# Patient Record
Sex: Male | Born: 1985 | Race: Black or African American | Hispanic: No | Marital: Single | Smoking: Former smoker
Health system: Southern US, Community
[De-identification: ages and names within clinical notes are randomized; demographics above are authoritative.]

## PROBLEM LIST (undated history)

## (undated) DIAGNOSIS — J45909 Unspecified asthma, uncomplicated: Secondary | ICD-10-CM

## (undated) DIAGNOSIS — A5149 Other secondary syphilitic conditions: Secondary | ICD-10-CM

## (undated) DIAGNOSIS — J302 Other seasonal allergic rhinitis: Secondary | ICD-10-CM

## (undated) HISTORY — DX: Other seasonal allergic rhinitis: J30.2

## (undated) HISTORY — PX: TONSILLECTOMY: SUR1361

## (undated) HISTORY — DX: Other secondary syphilitic conditions: A51.49

## (undated) HISTORY — DX: Unspecified asthma, uncomplicated: J45.909

---

## 2008-06-15 ENCOUNTER — Ambulatory Visit (HOSPITAL_COMMUNITY): Admission: RE | Admit: 2008-06-15 | Discharge: 2008-06-15 | Payer: Self-pay | Admitting: Allergy and Immunology

## 2011-04-10 ENCOUNTER — Ambulatory Visit (INDEPENDENT_AMBULATORY_CARE_PROVIDER_SITE_OTHER): Payer: BC Managed Care – PPO | Admitting: Internal Medicine

## 2011-04-10 DIAGNOSIS — Z113 Encounter for screening for infections with a predominantly sexual mode of transmission: Secondary | ICD-10-CM

## 2011-04-10 DIAGNOSIS — M545 Low back pain: Secondary | ICD-10-CM

## 2011-04-10 LAB — POCT UA - MICROSCOPIC ONLY
Bacteria, U Microscopic: NEGATIVE
Casts, Ur, LPF, POC: NEGATIVE
Mucus, UA: NEGATIVE

## 2011-04-10 LAB — POCT URINALYSIS DIPSTICK
Bilirubin, UA: NEGATIVE
Blood, UA: NEGATIVE
Glucose, UA: NEGATIVE
Spec Grav, UA: 1.02

## 2011-04-10 MED ORDER — CYCLOBENZAPRINE HCL 10 MG PO TABS: 10.0000 mg | ORAL_TABLET | Freq: Three times a day (TID) | ORAL | Status: AC | PRN

## 2011-04-10 MED ORDER — MELOXICAM 7.5 MG PO TABS: 7.5000 mg | ORAL_TABLET | Freq: Every day | ORAL | Status: DC

## 2011-04-10 NOTE — Progress Notes (Signed)
  Subjective:    Patient ID: Keith Becker, male    DOB: 05-31-1985, 26 y.o.   MRN: [id number]  Back Pain This is a recurrent problem. The current episode started more than 1 month ago. The problem occurs intermittently. The problem is unchanged. The pain is present in the lumbar spine. The quality of the pain is described as aching. The pain does not radiate. The pain is mild. The symptoms are aggravated by bending and twisting. Pertinent negatives include no abdominal pain, bladder incontinence, headaches, leg pain, perianal numbness or weakness. He has tried NSAIDs for the symptoms.  Keith Becker works two jobs, one at [employer] for [employer].  He has been doing this for 5 years and noticed about a year ago that he was having some low back stiffness and pain.  Ibuprophen helps at times.  He has no other associated symptoms.  It is not hurting him today. He also requests STD testing.  He has SSP, usually uses a condom but not always.  HE denies any lesions, penile discharge, pain or other symptoms.  He is unsure of his last STD test.      Review of Systems  Gastrointestinal: Negative for abdominal pain.  Genitourinary: Negative for bladder incontinence.  Musculoskeletal: Positive for back pain.  Neurological: Negative for weakness and headaches.  All other systems reviewed and are negative.       Objective:   Physical Exam  Vitals reviewed. Constitutional: He is oriented to person, place, and time. He appears well-developed and well-nourished.  HENT:  Head: Normocephalic.  Right Ear: External ear normal.  Left Ear: External ear normal.  Eyes: Conjunctivae are normal.  Neck: Neck supple.  Cardiovascular: Normal rate, regular rhythm and normal heart sounds.   Pulmonary/Chest: Effort normal and breath sounds normal.  Musculoskeletal:       L/S increased pain with flexion and hyperextension.  He has tight hamstrings.  Neurological: He is alert and oriented to person, place, and time. He has normal  reflexes. He displays normal reflexes.  Skin: Skin is warm and dry.  Psychiatric: He has a normal mood and affect. His behavior is normal.          Assessment & Plan:  Uriprobe, HIV, RPR, Herpes 1 & 2 pending.  Pt advised to use condoms consistently! Musculoskeletal Pain for the L/S:  For his low back pain he is given M/S exercises to strengthen his back.  Mobic and Flexeril given to use prn.  RTC if not improved in one week.

## 2011-04-11 LAB — RPR

## 2011-04-11 LAB — HIV ANTIBODY (ROUTINE TESTING W REFLEX): HIV: NONREACTIVE

## 2011-04-13 LAB — GC/CHLAMYDIA PROBE AMP, URINE: GC Probe Amp, Urine: NEGATIVE

## 2011-04-14 LAB — HSV(HERPES SIMPLEX VRS) I + II AB-IGG
HSV 1 Glycoprotein G Ab, IgG: <0.1 IV
HSV 2 Glycoprotein G Ab, IgG: 0.17 IV

## 2011-04-17 ENCOUNTER — Encounter: Payer: Self-pay | Admitting: Radiology

## 2011-04-18 ENCOUNTER — Telehealth: Payer: Self-pay

## 2011-04-18 NOTE — Telephone Encounter (Signed)
Please contact pt he forgot to give [place] the right number to contact him, he would like for someone to call him with his results on std screening

## 2011-04-19 NOTE — Telephone Encounter (Signed)
Pt notified of labs

## 2011-06-27 ENCOUNTER — Ambulatory Visit (INDEPENDENT_AMBULATORY_CARE_PROVIDER_SITE_OTHER): Payer: BC Managed Care – PPO | Admitting: Internal Medicine

## 2011-06-27 VITALS — BP 127/78 | HR 78 | Temp 98.1°F | Resp 16 | Ht 70.25 in | Wt 153.4 lb

## 2011-06-27 DIAGNOSIS — A609 Anogenital herpesviral infection, unspecified: Secondary | ICD-10-CM

## 2011-06-27 DIAGNOSIS — R21 Rash and other nonspecific skin eruption: Secondary | ICD-10-CM

## 2011-06-27 DIAGNOSIS — Z113 Encounter for screening for infections with a predominantly sexual mode of transmission: Secondary | ICD-10-CM

## 2011-06-27 DIAGNOSIS — R300 Dysuria: Secondary | ICD-10-CM

## 2011-06-27 DIAGNOSIS — N342 Other urethritis: Secondary | ICD-10-CM

## 2011-06-27 MED ORDER — VALACYCLOVIR HCL 1 G PO TABS: 1000.0000 mg | ORAL_TABLET | Freq: Two times a day (BID) | ORAL | Status: AC

## 2011-06-27 NOTE — Patient Instructions (Signed)
Urethritis, Adult Urethritis is an inflammation (soreness) of the urethra (the tube exiting from the bladder). It is often caused by germs that may be spread through sexual contact. TREATMENT  Urethritis will usually respond to antibiotics. These are medications that kill germs. Take all the medicine given to you. You may feel better in a couple days, but TAKE ALL MEDICINE or the infection may not be completely cured and may become more difficult to treat. Response can generally be expected in 7 to 10 days. You may require additional treatment after more testing. HOME CARE INSTRUCTIONS  Not have sex until the test results are known and treatment is completed.   Know that you may be asked to notify your sex partner when your final test results are back.   Finish all medications as prescribed.   Prevent sexually transmitted infections including AIDS. Practice safe sex. Use condoms.  SEEK MEDICAL CARE IF:   Your symptoms are not improved in 2 to 3 days.   Your symptoms are getting worse.   Your develop abdominal pain.   You develop joint pain.  SEEK IMMEDIATE MEDICAL CARE IF:   You have a fever.   You develop severe pain in the belly, back or side.   You develop repeated vomiting.  TEST RESULTS Not all test results are available during your visit. If your test results are not back during the visit, make an appointment with your caregiver to find out the results. Do not assume everything is normal if you have not heard from your caregiver or the medical facility. It is important for you to follow-up on all of your test results. Document Released: 07/22/2000 Document Revised: 01/15/2011 Document Reviewed: 02/11/2009 [facility] Patient Information 2012 [place], [place].Herpes Simplex Herpes simplex is generally classified as Type 1 or Type 2. Type 1 is generally the type that is responsible for cold sores. Type 2 is generally associated with sexually transmitted diseases. We now know that  most of the thoughts on these viruses are inaccurate. We find that HSV1 is also present genitally and HSV2 can be present orally, but this will vary in different locations of the world. Herpes simplex is usually detected by doing a culture. Blood tests are also available for this virus; however, the accuracy is often not as good.  PREPARATION FOR TEST No preparation or fasting is necessary. NORMAL FINDINGS  No virus present   No HSV antigens or antibodies present  Ranges for normal findings may vary among different laboratories and hospitals. You should always check with your doctor after having lab work or other tests done to discuss the meaning of your test results and whether your values are considered within normal limits. MEANING OF TEST  Your caregiver will go over the test results with you and discuss the importance and meaning of your results, as well as treatment options and the need for additional tests if necessary. OBTAINING THE TEST RESULTS  It is your responsibility to obtain your test results. Ask the lab or department performing the test when and how you will get your results. Document Released: 02/29/2004 Document Revised: 01/15/2011 Document Reviewed: 01/07/2008 [facility] Patient Information 2012 [place], [place].

## 2011-06-27 NOTE — Progress Notes (Signed)
  Subjective:    Patient ID: Keith Becker, male    DOB: Nov 06, 1985, 26 y.o.   MRN: [id number]  HPI Same sex partner C/o pain and rash penis, no discharge seen. Use condom, not for oral part Hx of previous STD  Review of Systems     Objective:   Physical Exam Penis has 2 small separate ulcer/vesicles No discharge seen No adenopathy       Assessment & Plan:  Uriprobe, viral culture done Do hiv and rpr 1 month , sooner prn  Doxy, valtrex po

## 2011-07-28 ENCOUNTER — Ambulatory Visit: Payer: BC Managed Care – PPO

## 2012-09-28 ENCOUNTER — Ambulatory Visit (INDEPENDENT_AMBULATORY_CARE_PROVIDER_SITE_OTHER): Payer: BC Managed Care – PPO | Admitting: Family Medicine

## 2012-09-28 VITALS — BP 110/62 | HR 74 | Temp 98.8°F | Resp 16 | Ht 70.0 in | Wt 162.0 lb

## 2012-09-28 DIAGNOSIS — D649 Anemia, unspecified: Secondary | ICD-10-CM

## 2012-09-28 DIAGNOSIS — R197 Diarrhea, unspecified: Secondary | ICD-10-CM

## 2012-09-28 DIAGNOSIS — K8062 Calculus of gallbladder and bile duct with acute cholecystitis without obstruction: Secondary | ICD-10-CM

## 2012-09-28 DIAGNOSIS — R1011 Right upper quadrant pain: Secondary | ICD-10-CM

## 2012-09-28 DIAGNOSIS — K8010 Calculus of gallbladder with chronic cholecystitis without obstruction: Secondary | ICD-10-CM

## 2012-09-28 LAB — POCT CBC
MCH, POC: 23.5 pg — AB (ref 27–31.2)
MCV: 74.9 fL — AB (ref 80–97)
MID (cbc): 0.6 (ref 0–0.9)
POC LYMPH PERCENT: 23.6 %L (ref 10–50)
POC MID %: 8.1 %M (ref 0–12)
Platelet Count, POC: 263 10*3/uL (ref 142–424)
RDW, POC: 14.7 %
WBC: 7.3 10*3/uL (ref 4.6–10.2)

## 2012-09-28 NOTE — Patient Instructions (Addendum)
Start an iron supplement twice a day - I recommend ferrous sulfate 324mg  (65mg  of elemental iron).  Take this on an empty stomach 30 minutes before eating or 2 hours after a meal.  Take it with a vitamin C supplement or a small glass of orange juice.  Cholelithiasis Cholelithiasis (also called gallstones) is a form of gallbladder disease where gallstones form in your gallbladder. The gallbladder is a non-essential organ that stores bile made in the liver, which helps digest fats. Gallstones begin as small crystals and slowly grow into stones. Gallstone pain occurs when the gallbladder spasms, and a gallstone is blocking the duct. Pain can also occur when a stone passes out of the duct.  Women are more likely to develop gallstones than men. Other factors that increase the risk of gallbladder disease are:  Having multiple pregnancies. Physicians sometimes advise removing diseased gallbladders before future pregnancies.  Obesity.  Diets heavy in fried foods and fat.  Increasing age (older than 72).  Prolonged use of medications containing male hormones.  Diabetes mellitus.  Rapid weight loss.  Family history of gallstones (heredity). SYMPTOMS  Feeling sick to your stomach (nauseous).  Abdominal pain.  Yellowing of the skin (jaundice).  Sudden pain. It may persist from several minutes to several hours.  Worsening pain with deep breathing or when jarred.  Fever.  Tenderness to the touch. In some cases, when gallstones do not move into the bile duct, people have no pain or symptoms. These are called "silent" gallstones. TREATMENT In severe cases, emergency surgery may be required. HOME CARE INSTRUCTIONS   Only take over-the-counter or prescription medicines for pain, discomfort, or fever as directed by your caregiver.  Follow a low-fat diet until seen again. Fat causes the gallbladder to contract, which can result in pain.  Follow up as instructed. Attacks are almost always  recurrent and surgery is usually required for permanent treatment. SEEK IMMEDIATE MEDICAL CARE IF:   Your pain increases and is not controlled by medications.  You have an oral temperature above 102 F (38.9 C), not controlled by medication.  You develop nausea and vomiting. MAKE SURE YOU:   Understand these instructions.  Will watch your condition.  Will get help right away if you are not doing well or get worse. Document Released: 01/22/2005 Document Revised: 04/20/2011 Document Reviewed: 03/27/2010 [facility] Patient Information 2014 [place], [place]. Iron Deficiency Anemia There are many types of anemia. Iron deficiency anemia is the most common. Iron deficiency anemia is a decrease in the number of red blood cells caused by too little iron. Without enough iron, your body does not produce enough hemoglobin. Hemoglobin is a substance in red blood cells that carries oxygen to the body's tissues. Iron deficiency anemia may leave you tired and short of breath. CAUSES   Lack of iron in the diet.  This may be seen in infants and children, because there is little iron in milk.  This may be seen in adults who do not eat enough iron-rich foods.  This may be seen in pregnant or breastfeeding women who do not take iron supplements. There is a much higher need for iron intake at these times.  Poor absorption of iron, as seen with intestinal disorders.  Intestinal bleeding.  Heavy periods. SYMPTOMS  Mild anemia may not be noticeable. Symptoms may include:  Fatigue.  Headache.  Pale skin.  Weakness.  Shortness of breath.  Dizziness.  Cold hands and feet.  Fast or irregular heartbeat. DIAGNOSIS  Diagnosis requires a  thorough evaluation and physical exam by your caregiver.  Blood tests are generally used to confirm iron deficiency anemia.  Additional tests may be done to find the underlying cause of your anemia. These may include:  Testing for blood in the stool (fecal  occult blood test).  A procedure to see inside the colon and rectum (colonoscopy).  A procedure to see inside the esophagus and stomach (endoscopy). TREATMENT   Correcting the cause of the iron deficiency is the first step.  Medicines, such as oral contraceptives, can make heavy menstrual flows lighter.  Antibiotics and other medicines can be used to treat peptic ulcers.  Surgery may be needed to remove a bleeding polyp, tumor, or fibroid.  Often, iron supplements (ferrous sulfate) are taken.  For the best iron absorption, take these supplements with an empty stomach.  You may need to take the supplements with food if you cannot tolerate them on an empty stomach. Vitamin C improves the absorption of iron. Your caregiver may recommend taking your iron tablets with a glass of orange juice or vitamin C supplement.  Milk and antacids should not be taken at the same time as iron supplements. They may interfere with the absorption of iron.  Iron supplements can cause constipation. A stool softener is often recommended.  Pregnant and breastfeeding women will need to take extra iron, because their normal diet usually will not provide the required amount.  Patients who cannot tolerate iron by mouth can take it through a vein (intravenously) or by an injection into the muscle. HOME CARE INSTRUCTIONS   Ask your dietitian for help with diet questions.  Take iron and vitamins as directed by your caregiver.  Eat a diet rich in iron. Eat liver, lean beef, whole-grain bread, eggs, dried fruit, and dark green leafy vegetables. SEEK IMMEDIATE MEDICAL CARE IF:   You have a fainting episode. Do not drive yourself. Call your local emergency services (911 in [place] if no other help is available.  You have chest pain, nausea, or vomiting.  You develop severe or increased shortness of breath with activities.  You develop weakness or increased thirst.  You have a rapid heartbeat.  You develop  unexplained sweating or become lightheaded when getting up from a chair or bed. MAKE SURE YOU:   Understand these instructions.  Will watch your condition.  Will get help right away if you are not doing well or get worse. Document Released: 01/24/2000 Document Revised: 04/20/2011 Document Reviewed: 06/04/2009 [facility] Patient Information 2014 [place], [place]. Iron-Rich Diet An iron-rich diet contains foods that are good sources of iron. Iron is an important mineral that helps your body produce hemoglobin. Hemoglobin is a protein in red blood cells that carries oxygen to the body's tissues. Sometimes, the iron level in your blood can be low. This may be caused by:  A lack of iron in your diet.  Blood loss.  Times of growth, such as during pregnancy or during a child's growth and development. Low levels of iron can cause a decrease in the number of red blood cells. This can result in iron deficiency anemia. Iron deficiency anemia symptoms include:  Tiredness.  Weakness.  Irritability.  Increased chance of infection. Here are some recommendations for daily iron intake:  Males older than 25 years of age need 8 mg of iron per day.  Women ages 69 to 70 need 18 mg of iron per day.  Pregnant women need 27 mg of iron per day, and women who are over  84 years of age and breastfeeding need 9 mg of iron per day.  Women over the age of 58 need 8 mg of iron per day. SOURCES OF IRON There are 2 types of iron that are found in food: heme iron and nonheme iron. Heme iron is absorbed by the body better than nonheme iron. Heme iron is found in meat, poultry, and fish. Nonheme iron is found in grains, beans, and vegetables. Heme Iron Sources Food / Iron (mg)  Chicken liver, 3 oz (85 g)/ 10 mg  Beef liver, 3 oz (85 g)/ 5.5 mg  Oysters, 3 oz (85 g)/ 8 mg  Beef, 3 oz (85 g)/ 2 to 3 mg  Shrimp, 3 oz (85 g)/ 2.8 mg  [place], 3 oz (85 g)/ 2 mg  Chicken, 3 oz (85 g) / 1 mg  Fish (tuna,  halibut), 3 oz (85 g)/ 1 mg  Pork, 3 oz (85 g)/ 0.9 mg Nonheme Iron Sources Food / Iron (mg)  Ready-to-eat breakfast cereal, iron-fortified / 3.9 to 7 mg  Tofu,  cup / 3.4 mg  Kidney beans,  cup / 2.6 mg  Baked potato with skin / 2.7 mg  Asparagus,  cup / 2.2 mg  Avocado / 2 mg  Dried peaches,  cup / 1.6 mg  Raisins,  cup / 1.5 mg  Soy milk, 1 cup / 1.5 mg  Whole-wheat bread, 1 slice / 1.2 mg  Spinach, 1 cup / 0.8 mg  Broccoli,  cup / 0.6 mg IRON ABSORPTION Certain foods can decrease the body's absorption of iron. Try to avoid these foods and beverages while eating meals with iron-containing foods:  Coffee.  Tea.  Fiber.  Soy. Foods containing vitamin C can help increase the amount of iron your body absorbs from iron sources, especially from nonheme sources. Eat foods with vitamin C along with iron-containing foods to increase your iron absorption. Foods that are high in vitamin C include many fruits and vegetables. Some good sources are:  Fresh orange juice.  Oranges.  Strawberries.  Mangoes.  Grapefruit.  Red bell peppers.  Green bell peppers.  Broccoli.  Potatoes with skin.  Tomato juice. Document Released: 09/09/2004 Document Revised: 04/20/2011 Document Reviewed: 07/17/2010 [facility] Patient Information 2014 [place], [place].

## 2012-09-28 NOTE — Progress Notes (Signed)
Subjective:    Patient ID: Keith Becker, male    DOB: Mar 06, 1985, 27 y.o.   MRN: [id number] Chief Complaint  Patient presents with  . Diarrhea    x 3 weeks  . Abdominal Pain  . Fever    1st 3 days of illness    HPI  Micah Flesher out to eat on 7/29 - [employer].  Then the following morning had abdominal pain and large volume diarrhea that quickly turned watery, had a headache, felt very ill. Also with low grade fever for 2d. Tried some liquid peptobismol as well as imodium ad which was only partially effective so switched to tablets from liquid.  Got better after 1 wk but notices that still whenever he eats greasy or fatty foods the diarrhea and central abdominal pain gets worse again - does ok on healthy foods - like salads.  No nausea or vomiting.  No blood in stool or melena.  Urine normal. No personal or family h/o gallbladder or pancreatic issues. Not taking any other otc meds, vitamins, or supplements. Drinks alcohol rarely.    Past Medical History  Diagnosis Date  . Asthma   . Seasonal allergies    No current outpatient prescriptions on file prior to visit.   No current facility-administered medications on file prior to visit.   No Known Allergies \ Past Surgical History  Procedure Laterality Date  . Tonsillectomy     Family History  Problem Relation Age of Onset  . Hypertension Mother   . Hypertension Father       Review of Systems  Constitutional: Positive for appetite change and fatigue. Negative for fever, chills, diaphoresis and activity change.  Respiratory: Negative for shortness of breath.   Cardiovascular: Negative for chest pain.  Gastrointestinal: Positive for abdominal pain and diarrhea. Negative for nausea, vomiting, constipation, blood in stool, abdominal distention, anal bleeding and rectal pain.  Genitourinary: Negative for dysuria, frequency and decreased urine volume.  Hematological: Negative for adenopathy.      BP 110/62  Pulse 74  Temp(Src)  98.8 F (37.1 C)  Resp 16  Ht 5\' 10"  (1.778 m)  Wt 162 lb (73.483 kg)  BMI 23.24 kg/m2  SpO2 99% Objective:   Physical Exam  Constitutional: He appears well-developed and well-nourished. No distress.  HENT:  Head: Normocephalic and atraumatic.  Neck: Normal range of motion. Neck supple. No thyromegaly present.  Cardiovascular: Normal rate, regular rhythm and normal heart sounds.   Pulmonary/Chest: Effort normal and breath sounds normal.  Abdominal: Soft. Normal appearance and bowel sounds are normal. He exhibits no distension and no mass. There is no hepatosplenomegaly. There is tenderness in the right upper quadrant, epigastric area and left upper quadrant. There is positive Murphy's sign. There is no rigidity, no rebound, no guarding, no CVA tenderness and no tenderness at McBurney's point. No hernia.  Genitourinary: Rectum normal and prostate normal. Rectal exam shows no tenderness and anal tone normal. Guaiac negative stool.  Lymphadenopathy:    He has no cervical adenopathy.  Skin: He is not diaphoretic.      Results for orders placed in visit on 09/28/12  POCT CBC      Result Value Range   WBC 7.3  4.6 - 10.2 K/uL   Lymph, poc 1.7  0.6 - 3.4   POC LYMPH PERCENT 23.6  10 - 50 %L   MID (cbc) 0.6  0 - 0.9   POC MID % 8.1  0 - 12 %M   POC Granulocyte 5.0  2 - 6.9   Granulocyte percent 68.3  37 - 80 %G   RBC 5.40  4.69 - 6.13 M/uL   Hemoglobin 12.7 (*) 14.1 - 18.1 g/dL   HCT, POC 16.1 (*) 09.6 - 53.7 %   MCV 74.9 (*) 80 - 97 fL   MCH, POC 23.5 (*) 27 - 31.2 pg   MCHC 31.4 (*) 31.8 - 35.4 g/dL   RDW, POC 04.5     Platelet Count, POC 263  142 - 424 K/uL   MPV 8.8  0 - 99.8 fL    Assessment & Plan:  Calculus of gallbladder with other cholecystitis, without mention of obstruction - Plan: US Abdomen Limited RUQ  RUQ abdominal pain and Diarrhea - Plan: US Abdomen Limited RUQ, POCT CBC - From history and exam, I strongly suspect that pt has gallstones - will refer for [place] to  confirm and then will likely need surgical referral. Until then, avoid greasy and fatty foods and ok to cont using prn imodium  Anemia - new - advised iron supp and recheck in sev weeks.  Consider ferritin at f/u as could be sickle cell trait rather than low iron.  Meds ordered this encounter  Medications  . bismuth subsalicylate (PEPTO BISMOL) 262 MG chewable tablet    Sig: Chew 524 mg by mouth as needed for indigestion.

## 2012-10-03 ENCOUNTER — Ambulatory Visit
Admission: RE | Admit: 2012-10-03 | Discharge: 2012-10-03 | Disposition: A | Discharge status: Home / Self Care | Payer: BC Managed Care – PPO | Source: Ambulatory Visit | Attending: Family Medicine | Admitting: Family Medicine

## 2012-10-04 ENCOUNTER — Telehealth: Payer: Self-pay

## 2012-10-04 NOTE — Telephone Encounter (Signed)
Notes Recorded by Sherren Mocha, MD on 10/04/2012 at 10:04 AM Your ultrasound came back normal so you do NOT have gallstones to explain your symptoms. We can do some more testing in clinic - including stool testing -to try to determine the cause of your symptoms - just come back to clinic at your first available opportunity. We can also recheck on your anemia then. If you would prefer, I would be happy to just refer you on to a GI doctor right now but it may take several weeks to get a new patient appointment - let me know what you prefer.  Left message for call back

## 2012-10-04 NOTE — Telephone Encounter (Signed)
Pt is looking for results of his scan   Best number [phone]

## 2012-10-05 NOTE — Telephone Encounter (Signed)
Called again. He prefers to come back in here for additional labs, he is not feeling better, he plans to come back in on Thursday, to you [facility]

## 2012-11-15 ENCOUNTER — Ambulatory Visit (INDEPENDENT_AMBULATORY_CARE_PROVIDER_SITE_OTHER): Payer: BC Managed Care – PPO | Admitting: Physician Assistant

## 2012-11-15 VITALS — BP 122/68 | HR 61 | Temp 97.8°F | Resp 18 | Ht 71.0 in | Wt 163.6 lb

## 2012-11-15 DIAGNOSIS — B009 Herpesviral infection, unspecified: Secondary | ICD-10-CM

## 2012-11-15 DIAGNOSIS — Z202 Contact with and (suspected) exposure to infections with a predominantly sexual mode of transmission: Secondary | ICD-10-CM

## 2012-11-15 DIAGNOSIS — Z7251 High risk heterosexual behavior: Secondary | ICD-10-CM

## 2012-11-15 DIAGNOSIS — Z9189 Other specified personal risk factors, not elsewhere classified: Secondary | ICD-10-CM

## 2012-11-15 MED ORDER — VALACYCLOVIR HCL 500 MG PO TABS: 500.0000 mg | ORAL_TABLET | Freq: Two times a day (BID) | ORAL | Status: DC

## 2012-11-15 NOTE — Progress Notes (Signed)
  Subjective:    Patient ID: Keith Becker, male    DOB: 07-10-1985, 27 y.o.   MRN: [id number]  HPI   Keith Becker is a pleasant 27 yr old male here with concern for an HSV outbreak.  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  This is his first outbreak since diagnosis.  Thinks this outbreak is worse than the initial outbreak.  Noted genital sores, similar to what he had previously, that started 3 days ago.  The sores are painful.  He has not noticed and blisters or fluid from the lesions.  Also notes some itching on his back and neck, wonders if this could also be HSV.  He does have a history of eczema.    Additionally requests STI testing today.  [facility], all negative at that time.  He is currently sexually active with 5-6 male partners.  He uses condoms 50% of the time.  He denies penile discharge, dysuria, testicular pain, abd pain.    Pt asks about HIV PrEP.  One of his sexual partners is known to be HIV+, and he is interested in reducing his risk of infection in general.     Review of Systems  Constitutional: Negative for fever and chills.  HENT: Negative.   Respiratory: Negative.   Cardiovascular: Negative.   Gastrointestinal: Negative.   Genitourinary: Positive for genital sores. Negative for dysuria, discharge and testicular pain.  Musculoskeletal: Negative.   Skin: Negative.   Neurological: Negative.        Objective:   Physical Exam  Vitals reviewed. Constitutional: He is oriented to person, place, and time. He appears well-developed and well-nourished. No distress.  HENT:  Head: Normocephalic and atraumatic.  Eyes: Conjunctivae are normal. No scleral icterus.  Cardiovascular: Normal rate, regular rhythm and normal heart sounds.   Pulmonary/Chest: Effort normal and breath sounds normal. He has no wheezes. He has no rales.  Abdominal: Soft. There is no tenderness.  Genitourinary:    No discharge found.  Few ulcerated and scabbed  erythematous lesions on penis; no vesicles or pustules; appears c/w with healing HSV lesions  Neurological: He is alert and oriented to person, place, and time.  Skin: Skin is warm and dry.  Psychiatric: He has a normal mood and affect. His behavior is normal.        Assessment & Plan:  HSV-1 (herpes simplex virus 1) infection - Plan: valACYclovir (VALTREX) 500 MG tablet  Possible exposure to STD - Plan: RPR, HIV antibody, GC/Chlamydia Probe Amp  High risk sexual behavior - Plan: RPR, HIV antibody, GC/Chlamydia Probe Amp   Keith Becker is a 27 yr old male here with genital lesions that appear consistent with HSV.  This is his second outbreak.  Will treat with valtrex bid x 3 days.  Pt not interested in suppression at this time, but we discussed this today.    Uriprobe pending.  HIV, RPR pending.  Pt with known HIV exposures.  Inquires about PrEP today.  We briefly discussed this, but would refer him to ID if he decides to pursue this.  I have provided him with educational materials from [place], and he knows to call if would like to be referred to the ID clinic.  Will follow up on labs and treat if necessary.

## 2012-11-15 NOTE — Patient Instructions (Addendum)
Begin taking the valacyclovir twice daily for 3 days.  I have given you extra so that you can begin right away if you have another outbreak.  We can always consider taking the medication for suppression to prevent outbreaks - that dose is 500mg  once daily.  I will let you know when the rest of your labs are back.    Use condoms with every sexual encounter!  This is the only way to protect yourself from infection   Safe Sex Safe sex is about reducing the risk of giving or getting a sexually transmitted disease (STD). STDs are spread through sexual contact involving the genitals, mouth, or rectum. Some STDS can be cured and others cannot. Safe sex can also prevent unintended pregnancies.  SAFE SEX PRACTICES  Limit your sexual activity to only one partner who is only having sex with you.  Talk to your partner about their past partners, past STDs, and drug use.  Use a condom every time you have sexual intercourse. This includes vaginal, oral, and anal sexual activity. Both females and males should wear condoms during oral sex. Only use latex or polyurethane condoms and water-based lubricants. Petroleum-based lubricants or oils used to lubricate a condom will weaken the condom and increase the chance that it will break. The condom should be in place from the beginning to the end of sexual activity. Wearing a condom reduces, but does not completely eliminate, your risk of getting or giving a STD. STDs can be spread by contact with skin of surrounding areas.  Get vaccinated for hepatitis B and HPV.  Avoid alcohol and recreational drugs which can affect your judgement. You may forget to use a condom or participate in high-risk sex.  For females, avoid douching after sexual intercourse. Douching can spread an infection farther into the reproductive tract.  Check your body for signs of sores, blisters, rashes, or unusual discharge. See your caregiver if you notice any of these signs.  Avoid sexual  contact if you have symptoms of an infection or are being treated for an STD. If you or your partner has herpes, avoid sexual contact when blisters are present. Use condoms at all other times.  See your caregiver for regular screenings, examinations, and tests for STDs. Before having sex with a new partner, each of you should be screened for STDs and talk about the results with your partner. BENEFITS OF SAFE SEX   There is less of a chance of getting or giving an STD.  You can prevent unwanted or unintended pregnancies.  By discussing safer sex concerns with your partner, you may increase feelings of intimacy, comfort, trust, and honesty between the both of you. Document Released: 03/05/2004 Document Revised: 10/21/2011 Document Reviewed: 07/20/2011 [facility] Patient Information 2014 [place], [place].

## 2013-05-08 ENCOUNTER — Ambulatory Visit (INDEPENDENT_AMBULATORY_CARE_PROVIDER_SITE_OTHER): Payer: BC Managed Care – PPO | Admitting: Emergency Medicine

## 2013-05-08 VITALS — BP 132/86 | HR 56 | Temp 98.3°F | Resp 16 | Ht 70.5 in | Wt 163.0 lb

## 2013-05-08 DIAGNOSIS — Z202 Contact with and (suspected) exposure to infections with a predominantly sexual mode of transmission: Secondary | ICD-10-CM

## 2013-05-08 NOTE — Patient Instructions (Signed)
Safe Sex Safe sex is about reducing the risk of giving or getting a sexually transmitted disease (STD). STDs are spread through sexual contact involving the genitals, mouth, or rectum. Some STDS can be cured and others cannot. Safe sex can also prevent unintended pregnancies.  SAFE SEX PRACTICES  Limit your sexual activity to only one partner who is only having sex with you.  Talk to your partner about their past partners, past STDs, and drug use.  Use a condom every time you have sexual intercourse. This includes vaginal, oral, and anal sexual activity. Both females and males should wear condoms during oral sex. Only use latex or polyurethane condoms and water-based lubricants. Petroleum-based lubricants or oils used to lubricate a condom will weaken the condom and increase the chance that it will break. The condom should be in place from the beginning to the end of sexual activity. Wearing a condom reduces, but does not completely eliminate, your risk of getting or giving a STD. STDs can be spread by contact with skin of surrounding areas.  Get vaccinated for hepatitis B and HPV.  Avoid alcohol and recreational drugs which can affect your judgement. You may forget to use a condom or participate in high-risk sex.  For females, avoid douching after sexual intercourse. Douching can spread an infection farther into the reproductive tract.  Check your body for signs of sores, blisters, rashes, or unusual discharge. See your caregiver if you notice any of these signs.  Avoid sexual contact if you have symptoms of an infection or are being treated for an STD. If you or your partner has herpes, avoid sexual contact when blisters are present. Use condoms at all other times.  See your caregiver for regular screenings, examinations, and tests for STDs. Before having sex with a new partner, each of you should be screened for STDs and talk about the results with your partner. BENEFITS OF SAFE SEX   There  is less of a chance of getting or giving an STD.  You can prevent unwanted or unintended pregnancies.  By discussing safer sex concerns with your partner, you may increase feelings of intimacy, comfort, trust, and honesty between the both of you. Document Released: 03/05/2004 Document Revised: 10/21/2011 Document Reviewed: 07/20/2011 [employer] Patient Information 2014 [employer].  

## 2013-05-08 NOTE — Progress Notes (Signed)
[facility] [address], [place] [address] [phone]  Date:  05/08/2013   Name:  Keith Becker   DOB:  [birth year]   MRN:  [id number]  PCP:  No primary provider on file.    Chief Complaint: Exposure to STD   History of Present Illness:  Keith Becker is a 28 y.o. very pleasant male patient who presents with the following:  Has regular unprotected sex.  Requests STD tests.  No symptoms.  History of GC in past.  No improvement with over the counter medications or other home remedies. Denies other complaint or health concern today.   Patient Active Problem List   Diagnosis Date Noted  . Recurrent low back pain 04/10/2011  . Screening examination for venereal disease 04/10/2011    Past Medical History  Diagnosis Date  . Asthma   . Seasonal allergies     Past Surgical History  Procedure Laterality Date  . Tonsillectomy      History  Substance Use Topics  . Smoking status: Current Some Day Smoker  . Smokeless tobacco: Not on file  . Alcohol Use: Yes    Family History  Problem Relation Age of Onset  . Hypertension Mother   . Hypertension Father     No Known Allergies  Medication list has been reviewed and updated.  Current Outpatient Prescriptions on File Prior to Visit  Medication Sig Dispense Refill  . valACYclovir (VALTREX) 500 MG tablet Take 1 tablet (500 mg total) by mouth 2 (two) times daily. For 3 days  30 tablet  0   No current facility-administered medications on file prior to visit.    Review of Systems:  As per HPI, otherwise negative.    Physical Examination: Filed Vitals:   05/08/13 1519  BP: 132/86  Pulse: 56  Temp: 98.3 F (36.8 C)  Resp: 16   Filed Vitals:   05/08/13 1519  Height: 5' 10.5" (1.791 m)  Weight: 163 lb (73.936 kg)   Body mass index is 23.05 kg/(m^2). Ideal Body Weight: Weight in (lb) to have BMI = 25: 176.4   GEN: WDWN, NAD, Non-toxic, Alert & Oriented x 3 HEENT: Atraumatic,  Normocephalic.  Ears and Nose: No external deformity. EXTR: No clubbing/cyanosis/edema NEURO: Normal gait.  PSYCH: Normally interactive. Conversant. Not depressed or anxious appearing.  Calm demeanor.  Genitalia:  Normal male  Assessment and Plan: Std exposure labs Signed,  Phillips OdorJeffery Anderson, MD

## 2013-05-09 LAB — HIV ANTIBODY (ROUTINE TESTING W REFLEX): HIV: NONREACTIVE

## 2013-05-09 LAB — GC/CHLAMYDIA PROBE AMP
CT Probe RNA: NEGATIVE
GC Probe RNA: NEGATIVE

## 2013-05-09 LAB — RPR

## 2013-11-08 ENCOUNTER — Ambulatory Visit (INDEPENDENT_AMBULATORY_CARE_PROVIDER_SITE_OTHER): Payer: BC Managed Care – PPO | Admitting: Emergency Medicine

## 2013-11-08 VITALS — BP 128/76 | HR 66 | Temp 99.1°F | Resp 16 | Ht 70.5 in | Wt 163.0 lb

## 2013-11-08 DIAGNOSIS — Z23 Encounter for immunization: Secondary | ICD-10-CM

## 2013-11-08 DIAGNOSIS — Z202 Contact with and (suspected) exposure to infections with a predominantly sexual mode of transmission: Secondary | ICD-10-CM

## 2013-11-08 DIAGNOSIS — Z7251 High risk heterosexual behavior: Secondary | ICD-10-CM

## 2013-11-08 DIAGNOSIS — K921 Melena: Secondary | ICD-10-CM

## 2013-11-08 LAB — POCT CBC
Granulocyte percent: 54.2 %G (ref 37–80)
HEMATOCRIT: 41.1 % — AB (ref 43.5–53.7)
Hemoglobin: 12.8 g/dL — AB (ref 14.1–18.1)
Lymph, poc: 2.2 (ref 0.6–3.4)
MCH: 22.8 pg — AB (ref 27–31.2)
MCHC: 31.1 g/dL — AB (ref 31.8–35.4)
MCV: 73.4 fL — AB (ref 80–97)
MID (cbc): 0.4 (ref 0–0.9)
MPV: 7.3 fL (ref 0–99.8)
POC GRANULOCYTE: 3.1 (ref 2–6.9)
POC LYMPH PERCENT: 39.2 %L (ref 10–50)
POC MID %: 6.6 %M (ref 0–12)
Platelet Count, POC: 207 10*3/uL (ref 142–424)
RBC: 5.6 M/uL (ref 4.69–6.13)
RDW, POC: 15.4 %
WBC: 5.7 10*3/uL (ref 4.6–10.2)

## 2013-11-08 NOTE — Patient Instructions (Signed)

## 2013-11-08 NOTE — Progress Notes (Signed)
[facility] [address], [place] [address] [phone]  Date:  11/08/2013   Name:  Keith CoonDaronte Moede   DOB:  May 15, 1985   MRN:  [id number]  PCP:  No PCP Per Patient    Chief Complaint: Exposure to STD, Flu Vaccine, wants to be checked for a hernia, Rectal Bleeding and Medication Refill   History of Present Illness:  Keith CoonDaronte Chavarria is a 28 y.o. very pleasant male patient who presents with the following:  Requesting a flu shot anad STD check as he has an outbreak of HSV2 and that reminds him to get tested. Says that he has frequent BRBPR with stools.  No pain.   Concerned he may have a hernia.   No improvement with over the counter medications or other home remedies. Denies other complaint or health concern today.   Patient Active Problem List   Diagnosis Date Noted  . Recurrent low back pain 04/10/2011  . Screening examination for venereal disease 04/10/2011    Past Medical History  Diagnosis Date  . Asthma   . Seasonal allergies     Past Surgical History  Procedure Laterality Date  . Tonsillectomy      History  Substance Use Topics  . Smoking status: Current Some Day Smoker  . Smokeless tobacco: Not on file  . Alcohol Use: Yes    Family History  Problem Relation Age of Onset  . Hypertension Mother   . Hypertension Father     No Known Allergies  Medication list has been reviewed and updated.  Current Outpatient Prescriptions on File Prior to Visit  Medication Sig Dispense Refill  . valACYclovir (VALTREX) 500 MG tablet Take 1 tablet (500 mg total) by mouth 2 (two) times daily. For 3 days  30 tablet  0   No current facility-administered medications on file prior to visit.    Review of Systems:  As per HPI, otherwise negative.    Physical Examination: Filed Vitals:   11/08/13 1347  BP: 128/76  Pulse: 66  Temp: 99.1 F (37.3 C)  Resp: 16   Filed Vitals:   11/08/13 1347  Height: 5' 10.5" (1.791 m)  Weight: 163 lb  (73.936 kg)   Body mass index is 23.05 kg/(m^2). Ideal Body Weight: Weight in (lb) to have BMI = 25: 176.4  GEN: WDWN, NAD, Non-toxic, A & O x 3 HEENT: Atraumatic, Normocephalic. Neck supple. No masses, No LAD. Ears and Nose: No external deformity. CV: RRR, No M/G/R. No JVD. No thrill. No extra heart sounds. PULM: CTA B, no wheezes, crackles, rhonchi. No retractions. No resp. distress. No accessory muscle use. ABD: S, NT, ND, +BS. No rebound. No HSM. EXTR: No c/c/e NEURO Normal gait.  PSYCH: Normally interactive. Conversant. Not depressed or anxious appearing.  Calm demeanor.  GENITALIA;  Numerous scarred lesions on glans and shaft  Scrotum, epididymis and cord and testes normal no hernia    Assessment and Plan: STD check Rectal bleeding  Signed,  Phillips OdorJeffery Mahlia Fernando, MD

## 2013-11-09 LAB — HIV ANTIBODY (ROUTINE TESTING W REFLEX): HIV 1&2 Ab, 4th Generation: NONREACTIVE

## 2013-11-09 LAB — RPR: RPR Ser Ql: REACTIVE — AB

## 2013-11-09 LAB — FLUORESCENT TREPONEMAL AB(FTA)-IGG-BLD: FLUORESCENT TREPONEMAL ABS: REACTIVE — AB

## 2013-11-09 LAB — GC/CHLAMYDIA PROBE AMP
CT Probe RNA: NEGATIVE
GC Probe RNA: NEGATIVE

## 2013-11-09 LAB — RPR TITER: RPR Titer: 1:2 {titer}

## 2013-11-10 ENCOUNTER — Ambulatory Visit (INDEPENDENT_AMBULATORY_CARE_PROVIDER_SITE_OTHER): Payer: BC Managed Care – PPO | Admitting: Internal Medicine

## 2013-11-10 VITALS — BP 110/74 | HR 60 | Temp 98.2°F | Resp 18 | Ht 71.0 in | Wt 161.0 lb

## 2013-11-10 DIAGNOSIS — N489 Disorder of penis, unspecified: Secondary | ICD-10-CM

## 2013-11-10 DIAGNOSIS — R599 Enlarged lymph nodes, unspecified: Secondary | ICD-10-CM

## 2013-11-10 DIAGNOSIS — R591 Generalized enlarged lymph nodes: Secondary | ICD-10-CM

## 2013-11-10 DIAGNOSIS — R21 Rash and other nonspecific skin eruption: Secondary | ICD-10-CM

## 2013-11-10 DIAGNOSIS — A519 Early syphilis, unspecified: Secondary | ICD-10-CM

## 2013-11-10 MED ORDER — PENICILLIN G BENZATHINE 1200000 UNIT/2ML IM SUSP
2.4000 10*6.[IU] | Freq: Once | INTRAMUSCULAR | Status: AC
  Administered 2013-11-10: 2.4 10*6.[IU] via INTRAMUSCULAR

## 2013-11-10 MED ORDER — DOXYCYCLINE HYCLATE 100 MG PO TABS: 100.0000 mg | ORAL_TABLET | Freq: Two times a day (BID) | ORAL | Status: DC

## 2013-11-10 NOTE — Progress Notes (Signed)
   Subjective:    Patient ID: Keith Becker, male    DOB: 14-Feb-1985, 28 y.o.   MRN: [id number]  HPI [name] is a 28 y.o. Male who presents to clinic stating he was seen two days ago by Marice PotterJeff Anderson in which he was tested for STD's. He c/o genital lesions in which he tested positive for syphilis.  He was informed to RTC today for treatment.  Never had syphilis before. Has hsv and a hx of gonorrhea. Has sex with multiple men. Discussed protection and risk. Has healing chancres and lymp nodes tender in groin. See std screening tests, all negative except rpr  Review of Systems     Objective:   Physical Exam  Constitutional: He is oriented to person, place, and time. He appears well-developed and well-nourished.  HENT:  Head: Normocephalic.  Mouth/Throat: Oropharynx is clear and moist.  Eyes: EOM are normal. Pupils are equal, round, and reactive to light.  Neck: Normal range of motion. Neck supple.  Pulmonary/Chest: Effort normal.  Abdominal: Soft.  Genitourinary: Testes normal.    Circumcised. Penile tenderness present. No penile erythema. No discharge found.  3 healed chancres on shaft and glans  Musculoskeletal: Normal range of motion.  Lymphadenopathy:       Right: Inguinal adenopathy present.       Left: Inguinal adenopathy present.  Neurological: He is alert and oriented to person, place, and time. He exhibits normal muscle tone. Coordination normal.  Psychiatric: He has a normal mood and affect. His behavior is normal. Judgment and thought content normal.          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

## 2013-11-10 NOTE — Patient Instructions (Addendum)
Sexually Transmitted Disease A sexually transmitted disease (STD) is a disease or infection that may be passed (transmitted) from person to person, usually during sexual activity. This may happen by way of saliva, semen, blood, vaginal mucus, or urine. Common STDs include:   Gonorrhea.   Chlamydia.   Syphilis.   HIV and AIDS.   Genital herpes.   Hepatitis B and C.   Trichomonas.   Human papillomavirus (HPV).   Pubic lice.   Scabies.  Mites.  Bacterial vaginosis. WHAT ARE CAUSES OF STDs? An STD may be caused by bacteria, a virus, or parasites. STDs are often transmitted during sexual activity if one person is infected. However, they may also be transmitted through nonsexual means. STDs may be transmitted after:   Sexual intercourse with an infected person.   Sharing sex toys with an infected person.   Sharing needles with an infected person or using unclean piercing or tattoo needles.  Having intimate contact with the genitals, mouth, or rectal areas of an infected person.   Exposure to infected fluids during birth. WHAT ARE THE SIGNS AND SYMPTOMS OF STDs? Different STDs have different symptoms. Some people may not have any symptoms. If symptoms are present, they may include:   Painful or bloody urination.   Pain in the pelvis, abdomen, vagina, anus, throat, or eyes.   A skin rash, itching, or irritation.  Growths, ulcerations, blisters, or sores in the genital and anal areas.  Abnormal vaginal discharge with or without bad odor.   Penile discharge in men.   Fever.   Pain or bleeding during sexual intercourse.   Swollen glands in the groin area.   Yellow skin and eyes (jaundice). This is seen with hepatitis.   Swollen testicles.  Infertility.  Sores and blisters in the mouth. HOW ARE STDs DIAGNOSED? To make a diagnosis, your health care provider may:   Take a medical history.   Perform a physical exam.   Take a sample of  any discharge to examine.  Swab the throat, cervix, opening to the penis, rectum, or vagina for testing.  Test a sample of your first morning urine.   Perform blood tests.   Perform a Pap test, if this applies.   Perform a colposcopy.   Perform a laparoscopy.  HOW ARE STDs TREATED? Treatment depends on the STD. Some STDs may be treated but not cured.   Chlamydia, gonorrhea, trichomonas, and syphilis can be cured with antibiotic medicine.   Genital herpes, hepatitis, and HIV can be treated, but not cured, with prescribed medicines. The medicines lessen symptoms.   Genital warts from HPV can be treated with medicine or by freezing, burning (electrocautery), or surgery. Warts may come back.   HPV cannot be cured with medicine or surgery. However, abnormal areas may be removed from the cervix, vagina, or vulva.   If your diagnosis is confirmed, your recent sexual partners need treatment. This is true even if they are symptom-free or have a negative culture or evaluation. They should not have sex until their health care providers say it is okay. HOW CAN I REDUCE MY RISK OF GETTING AN STD? Take these steps to reduce your risk of getting an STD:  Use latex condoms, dental dams, and water-soluble lubricants during sexual activity. Do not use petroleum jelly or oils.  Avoid having multiple sex partners.  Do not have sex with someone who has other sex partners.  Do not have sex with anyone you do not know or who is at   high risk for an STD.  Avoid risky sex practices that can break your skin.  Do not have sex if you have open sores on your mouth or skin.  Avoid drinking too much alcohol or taking illegal drugs. Alcohol and drugs can affect your judgment and put you in a vulnerable position.  Avoid engaging in oral and anal sex acts.  Get vaccinated for HPV and hepatitis. If you have not received these vaccines in the past, talk to your health care provider about whether one  or both might be right for you.   If you are at risk of being infected with HIV, it is recommended that you take a prescription medicine daily to prevent HIV infection. This is called pre-exposure prophylaxis (PrEP). You are considered at risk if:  You are a man who has sex with other men (MSM).  You are a heterosexual man or woman and are sexually active with more than one partner.  You take drugs by injection.  You are sexually active with a partner who has HIV.  Talk with your health care provider about whether you are at high risk of being infected with HIV. If you choose to begin PrEP, you should first be tested for HIV. You should then be tested every 3 months for as long as you are taking PrEP.  WHAT SHOULD I DO IF I THINK I HAVE AN STD?  See your health care provider.   Tell your sexual partner(s). They should be tested and treated for any STDs.  Do not have sex until your health care provider says it is okay. WHEN SHOULD I GET IMMEDIATE MEDICAL CARE? Contact your health care provider right away if: Syphilis Syphilis is an infectious disease. It can cause serious complications if left untreated.  CAUSES  Syphilis is caused by a type of bacteria called Treponema pallidum. It is most commonly spread through sexual contact. Syphilis may also spread to a fetus through the blood of the mother.  SIGNS AND SYMPTOMS Symptoms vary depending on the stage of the disease. Some symptoms may disappear without treatment. However, this does not mean that the infection is gone. One form of syphilis (called latent syphilis) has no symptoms.  Primary Syphilis Painless sores (chancres) in and around the genital organs and mouth. Swollen lymph nodes near the sores. Secondary Syphilis A rash or sores over any portion of the body, including the palms of the hands and soles of the feet. Fever. Headache. Sore throat. Swollen lymph nodes. New sores in the mouth or on the genitals. Feeling  generally ill. Having pain in the joints. Tertiary Syphilis The third stage of syphilis involves severe damage to different organs in the body, such as the brain, spinal cord, and heart. Signs and symptoms may include:  Dementia. Personality and mood changes. Difficulty walking. Heart failure. Fainting. Enlargement (aneurysm) of the aorta. Tumors of the skin, bones, or liver. Muscle weakness. Sudden "lightning" pains, numbness, or tingling. Problems with coordination. Vision changes. DIAGNOSIS  A physical exam will be done. Blood tests will be done to confirm the diagnosis. If the disease is in the first or second stages, a fluid (drainage) sample from a sore or rash may be examined under a microscope to detect the disease-causing bacteria. Fluid around the spine may need to be examined to detect brain damage or inflammation of the brain lining (meningitis). If the disease is in the third stage, X-rays, CT scans, MRIs, echocardiograms, ultrasounds, or cardiac catheterization may also be done  to detect disease of the heart, aorta, or brain. TREATMENT  Syphilis can be cured with antibiotic medicine if a diagnosis is made early. During the first day of treatment, you may experience fever, chills, headache, nausea, or aching all over your body. This is a normal reaction to the antibiotics.  HOME CARE INSTRUCTIONS  Take your antibiotic medicine as directed by your health care provider. Finish the antibiotic even if you start to feel better. Incomplete treatment will put you at risk for continued infection and could be life threatening. Take medicines only as directed by your health care provider. Do not have sexual intercourse until your treatment is completed or as directed by your health care provider. Inform your recent sexual partners that you were diagnosed with syphilis. They need to seek care and treatment, even if they have no symptoms. It is necessary that all your sexual partners be  tested for infection and treated if they have the disease. Keep all follow-up visits as directed by your health care provider. It is important to keep all your appointments. If your test results are not ready during your visit, make an appointment with your health care provider to find out the results. Do not assume everything is normal if you have not heard from your health care provider or the medical facility. It is your responsibility to get your test results. SEEK MEDICAL CARE IF: You continue to have any of the following 24 hours after beginning treatment: Fever. Chills. Headache. Nausea. Aching all over your body. You have symptoms of an allergic reaction to medicine, such as: Chills. A headache. Light-headedness. A new rash (especially hives). Difficulty breathing. MAKE SURE YOU:  Understand these instructions. Will watch your condition. Will get help right away if you are not doing well or get worse. Document Released: 11/16/2012 Document Revised: 06/12/2013 Document Reviewed: 11/16/2012 [facility] Patient Information 2015 [place], [place]. This information is not intended to replace advice given to you by your health care provider. Make sure you discuss any questions you have with your health care provider.   You have severe abdominal pain.  You are a man and notice swelling or pain in your testicles.  You are a woman and notice swelling or pain in your vagina. Document Released: 04/18/2002 Document Revised: 01/31/2013 Document Reviewed: 08/16/2012 [facility] Patient Information 2015 [place], [place]. This information is not intended to replace advice given to you by your health care provider. Make sure you discuss any questions you have with your health care provider.

## 2013-12-18 ENCOUNTER — Encounter: Payer: Self-pay | Admitting: Emergency Medicine

## 2013-12-27 ENCOUNTER — Ambulatory Visit (INDEPENDENT_AMBULATORY_CARE_PROVIDER_SITE_OTHER): Payer: BC Managed Care – PPO | Admitting: Family Medicine

## 2013-12-27 VITALS — BP 120/72 | HR 55 | Temp 98.6°F | Resp 16 | Ht 71.0 in | Wt 158.2 lb

## 2013-12-27 DIAGNOSIS — Z202 Contact with and (suspected) exposure to infections with a predominantly sexual mode of transmission: Secondary | ICD-10-CM

## 2013-12-27 NOTE — Patient Instructions (Signed)
Referral is being made to infectious disease per your request  We will let you know the results of your tests.

## 2013-12-27 NOTE — Progress Notes (Signed)
Subjective: 28 year old male who is here for a follow-up visit regarding his recent treatment for syphilis. The sore has resolved. He has continued risk of STDs, and would like other testing done also. He has been studying things and is interested in pursuing prep therapy regimen for prophylaxis preexposure for HIV. He has had multiple same-sex partners, and recognizes his risk. He is not having any symptoms at this time.  Objective: Still is a little residual hypopigmented area on the shaft of his penis visible from his previous infection, but all signs of acute infection resolved.  Assessment: Status post syphilis STD wrist  Plan: HIV, RPR, URiprobe  Make referral to infectious disease to try and see if his treatment can be given him.

## 2013-12-28 LAB — GC/CHLAMYDIA PROBE AMP
CT Probe RNA: NEGATIVE
GC PROBE AMP APTIMA: NEGATIVE

## 2013-12-28 LAB — RPR

## 2013-12-28 LAB — HIV ANTIBODY (ROUTINE TESTING W REFLEX): HIV 1&2 Ab, 4th Generation: NONREACTIVE

## 2014-01-15 ENCOUNTER — Ambulatory Visit: Payer: BC Managed Care – PPO | Admitting: Infectious Disease

## 2014-01-29 ENCOUNTER — Ambulatory Visit (INDEPENDENT_AMBULATORY_CARE_PROVIDER_SITE_OTHER): Payer: BLUE CROSS/BLUE SHIELD | Admitting: Infectious Disease

## 2014-01-29 ENCOUNTER — Encounter: Payer: Self-pay | Admitting: Infectious Disease

## 2014-01-29 VITALS — BP 116/75 | HR 74 | Temp 98.4°F | Wt 157.0 lb

## 2014-01-29 DIAGNOSIS — Z113 Encounter for screening for infections with a predominantly sexual mode of transmission: Secondary | ICD-10-CM

## 2014-01-29 DIAGNOSIS — A5149 Other secondary syphilitic conditions: Secondary | ICD-10-CM

## 2014-01-29 DIAGNOSIS — Z7251 High risk heterosexual behavior: Secondary | ICD-10-CM | POA: Insufficient documentation

## 2014-01-29 LAB — COMPLETE METABOLIC PANEL WITH GFR
ALT: 15 U/L (ref 0–53)
AST: 15 U/L (ref 0–37)
Albumin: 4.4 g/dL (ref 3.5–5.2)
Alkaline Phosphatase: 56 U/L (ref 39–117)
BUN: 10 mg/dL (ref 6–23)
CO2: 27 mEq/L (ref 19–32)
Calcium: 9.7 mg/dL (ref 8.4–10.5)
Chloride: 104 mEq/L (ref 96–112)
Creat: 0.73 mg/dL (ref 0.50–1.35)
GFR, Est African American: >89 mL/min
GFR, Est Non African American: >89 mL/min
Glucose, Bld: 85 mg/dL (ref 70–99)
Potassium: 4.2 mEq/L (ref 3.5–5.3)
Sodium: 142 mEq/L (ref 135–145)
Total Bilirubin: 0.9 mg/dL (ref 0.2–1.2)
Total Protein: 7.2 g/dL (ref 6.0–8.3)

## 2014-01-29 LAB — CBC WITH DIFFERENTIAL/PLATELET
Basophils Absolute: 0 10*3/uL (ref 0.0–0.1)
Basophils Relative: 1 % (ref 0–1)
Eosinophils Absolute: 0.1 10*3/uL (ref 0.0–0.7)
Eosinophils Relative: 3 % (ref 0–5)
HCT: 40.4 % (ref 39.0–52.0)
HEMOGLOBIN: 13 g/dL (ref 13.0–17.0)
LYMPHS ABS: 2.1 10*3/uL (ref 0.7–4.0)
LYMPHS PCT: 49 % — AB (ref 12–46)
MCH: 23 pg — ABNORMAL LOW (ref 26.0–34.0)
MCHC: 32.2 g/dL (ref 30.0–36.0)
MCV: 71.5 fL — ABNORMAL LOW (ref 78.0–100.0)
MPV: 9.9 fL (ref 9.4–12.4)
Monocytes Absolute: 0.4 10*3/uL (ref 0.1–1.0)
Monocytes Relative: 10 % (ref 3–12)
NEUTROS PCT: 37 % — AB (ref 43–77)
Neutro Abs: 1.6 10*3/uL — ABNORMAL LOW (ref 1.7–7.7)
Platelets: 205 10*3/uL (ref 150–400)
RBC: 5.65 MIL/uL (ref 4.22–5.81)
RDW: 15.6 % — ABNORMAL HIGH (ref 11.5–15.5)
WBC: 4.2 10*3/uL (ref 4.0–10.5)

## 2014-01-29 NOTE — Progress Notes (Signed)
Subjective:    Patient ID: Keith Becker, male    DOB: 02-05-1986, 28 y.o.   MRN: [id number]  HPI  Keith Becker is a 28 year old African American man who is at extremely high risk for acquiring HIV infection.  He has had well over 100 sexual partners in the past year. He has used condoms with some of these partners with many he has not. Some of these partners of an HIV and others have claims that they have not been HIV infected. His last sexual encounter was on Friday and involved sex with condoms and without condoms. His last HIV test was in November. He has finished treatment for syphilis and his RPR is now non-reactive.  He clearly needs to be on preexposure prophylaxis for HIV infection with Truvada and was referred here by us physician from urgent care.    Review of Systems  Constitutional: Negative for fever, chills, diaphoresis, activity change, appetite change, fatigue and unexpected weight change.  HENT: Negative for congestion, rhinorrhea, sinus pressure, sneezing, sore throat and trouble swallowing.   Eyes: Negative for photophobia and visual disturbance.  Respiratory: Negative for cough, chest tightness, shortness of breath, wheezing and stridor.   Cardiovascular: Negative for chest pain, palpitations and leg swelling.  Gastrointestinal: Negative for nausea, vomiting, abdominal pain, diarrhea, constipation, blood in stool, abdominal distention and anal bleeding.  Genitourinary: Negative for dysuria, hematuria, flank pain and difficulty urinating.  Musculoskeletal: Negative for myalgias, back pain, joint swelling, arthralgias and gait problem.  Skin: Negative for color change, pallor, rash and wound.  Neurological: Negative for dizziness, tremors, weakness and light-headedness.  Hematological: Negative for adenopathy. Does not bruise/bleed easily.  Psychiatric/Behavioral: Negative for behavioral problems, confusion, sleep disturbance, dysphoric mood, decreased concentration and  agitation.       Objective:   Physical Exam  Constitutional: He is oriented to person, place, and time. He appears well-developed and well-nourished.  HENT:  Head: Normocephalic and atraumatic.  Eyes: Conjunctivae and EOM are normal.  Neck: Normal range of motion. Neck supple.  Cardiovascular: Normal rate and regular rhythm.   Pulmonary/Chest: Effort normal. No respiratory distress. He has no wheezes.  Abdominal: Soft. He exhibits no distension.  Musculoskeletal: Normal range of motion. He exhibits no edema or tenderness.  Neurological: He is alert and oriented to person, place, and time.  Skin: Skin is warm and dry. No rash noted. No erythema. No pallor.  Psychiatric: He has a normal mood and affect. His behavior is normal. Judgment and thought content normal.          Assessment & Plan:  Z72.5 other problems related to lifestyle, high risk sexual behavior:  --Check  HIV RNA today along with an HIV antibody and bring back for another HIV RNA test on January 4 one a clinic opens. There is a small but distinct possibility he could have become infected through intercourse on Friday night which was not protected if that person had HIV (which they claimed they did not)  --If he is HIV antibody negative today and RNA negative, I am willing to start Truvada keeping in mind there is a small possibility that she could be infected arleady and that we would expose him to under treatment for active HIV. I think that risk is minimal IF he is only receiving Truvada through the 4th when we can retest him and quickly rule out infection from the event this past Friday.  He still clearly needs to use condoms when possible  We will check a  hepatitis panel as well as compromise metabolic panel and CBC with differential. Will routinely check him for HIV and other transmitted diseases. I spent greater than 45 minutes with the patient including greater than 50% of time in face to face counsel of the  patient and in coordination of their care.     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 Glycoprotein antibodies were interestingly -2 months prior to this testing positive

## 2014-01-30 LAB — HIV-1 RNA QUANT-NO REFLEX-BLD
HIV 1 RNA Quant: <20 copies/mL (ref <20)
HIV-1 RNA Quant, Log: <1.3 {Log} (ref <1.30)

## 2014-01-30 LAB — HEPATITIS B SURFACE ANTIBODY,QUALITATIVE: Hep B S Ab: POSITIVE — AB

## 2014-01-30 LAB — HEPATITIS PANEL, ACUTE
HCV AB: NEGATIVE
HEP B S AG: NEGATIVE
Hep A IgM: NONREACTIVE
Hep B C IgM: NONREACTIVE

## 2014-01-30 LAB — HIV ANTIBODY (ROUTINE TESTING W REFLEX): HIV: NONREACTIVE

## 2014-02-12 ENCOUNTER — Other Ambulatory Visit: Payer: BLUE CROSS/BLUE SHIELD

## 2014-02-12 DIAGNOSIS — Z7251 High risk heterosexual behavior: Secondary | ICD-10-CM

## 2014-02-14 LAB — HIV-1 RNA QUANT-NO REFLEX-BLD: HIV-1 RNA Quant, Log: <1.3 {Log} (ref <1.30)

## 2014-02-26 ENCOUNTER — Encounter: Payer: Self-pay | Admitting: Infectious Disease

## 2014-02-26 ENCOUNTER — Ambulatory Visit (INDEPENDENT_AMBULATORY_CARE_PROVIDER_SITE_OTHER): Payer: BLUE CROSS/BLUE SHIELD | Admitting: Infectious Disease

## 2014-02-26 VITALS — BP 120/79 | HR 58 | Temp 98.3°F | Ht 70.5 in | Wt 158.0 lb

## 2014-02-26 DIAGNOSIS — Z113 Encounter for screening for infections with a predominantly sexual mode of transmission: Secondary | ICD-10-CM

## 2014-02-26 DIAGNOSIS — A5149 Other secondary syphilitic conditions: Secondary | ICD-10-CM

## 2014-02-26 DIAGNOSIS — Z7251 High risk heterosexual behavior: Secondary | ICD-10-CM

## 2014-02-26 MED ORDER — EMTRICITABINE-TENOFOVIR DF 200-300 MG PO TABS: 1.0000 | ORAL_TABLET | Freq: Every day | ORAL | Status: DC

## 2014-02-26 NOTE — Progress Notes (Signed)
   Subjective:    Patient ID: Keith Becker, male    DOB: 1985/12/07, 29 y.o.   MRN: [id number]  HPI   Keith Becker is a 29 year old African American man who is at extremely high risk for acquiring HIV infection.  He has had well over 100 sexual partners in the past year. He has used condoms with some of these partners with many he has not. Some of these partners of an HIV and others have claims that they have not been HIV infected. His last sexual encounter was on Friday prior to my seeing him  and involved sex with condoms and without condoms. His last HIV test was in November. He has finished treatment for syphilis and his RPR is now non-reactive.  We checked HIV RNA at last visit and in the New Year. He had one partner who he is sure is HIV - and had unprotected sex with him since January 4th.    Review of Systems  Constitutional: Negative for fever, chills, diaphoresis, activity change, appetite change, fatigue and unexpected weight change.  HENT: Negative for congestion, rhinorrhea, sinus pressure, sneezing, sore throat and trouble swallowing.   Eyes: Negative for photophobia and visual disturbance.  Respiratory: Negative for cough, chest tightness, shortness of breath, wheezing and stridor.   Cardiovascular: Negative for chest pain, palpitations and leg swelling.  Gastrointestinal: Negative for nausea, vomiting, abdominal pain, diarrhea, constipation, blood in stool, abdominal distention and anal bleeding.  Genitourinary: Negative for dysuria, hematuria, flank pain and difficulty urinating.  Musculoskeletal: Negative for myalgias, back pain, joint swelling, arthralgias and gait problem.  Skin: Negative for color change, pallor, rash and wound.  Neurological: Negative for dizziness, tremors, weakness and light-headedness.  Hematological: Negative for adenopathy. Does not bruise/bleed easily.  Psychiatric/Behavioral: Negative for behavioral problems, confusion, sleep disturbance,  dysphoric mood, decreased concentration and agitation.       Objective:   Physical Exam  Constitutional: He is oriented to person, place, and time. He appears well-developed and well-nourished.  HENT:  Head: Normocephalic and atraumatic.  Eyes: Conjunctivae and EOM are normal.  Neck: Normal range of motion. Neck supple.  Cardiovascular: Normal rate and regular rhythm.   Pulmonary/Chest: Effort normal. No respiratory distress. He has no wheezes.  Abdominal: Soft. He exhibits no distension.  Musculoskeletal: Normal range of motion. He exhibits no edema or tenderness.  Neurological: He is alert and oriented to person, place, and time.  Skin: Skin is warm and dry. No rash noted. No erythema. No pallor.  Psychiatric: He has a normal mood and affect. His behavior is normal. Judgment and thought content normal.          Assessment & Plan:  Z72.5 other problems related to lifestyle, high risk sexual behavior:  --Check  HIV RNA today  And send rx for TRUVADA for one month, RTC in one month and recheck labs including for STDS  Need for hepatitis vaccine: make sure gets series for hep a and b     Syphilis: RPR now NR

## 2014-02-27 LAB — HIV-1 RNA ULTRAQUANT REFLEX TO GENTYP+: HIV 1 RNA Quant: <20 copies/mL (ref <20)

## 2014-03-29 ENCOUNTER — Encounter: Payer: Self-pay | Admitting: Infectious Disease

## 2014-03-29 ENCOUNTER — Ambulatory Visit (INDEPENDENT_AMBULATORY_CARE_PROVIDER_SITE_OTHER): Payer: BLUE CROSS/BLUE SHIELD | Admitting: Infectious Disease

## 2014-03-29 VITALS — BP 121/77 | HR 73 | Temp 98.0°F | Ht 70.0 in | Wt 158.0 lb

## 2014-03-29 DIAGNOSIS — Z113 Encounter for screening for infections with a predominantly sexual mode of transmission: Secondary | ICD-10-CM | POA: Diagnosis not present

## 2014-03-29 DIAGNOSIS — A5149 Other secondary syphilitic conditions: Secondary | ICD-10-CM

## 2014-03-29 DIAGNOSIS — Z23 Encounter for immunization: Secondary | ICD-10-CM | POA: Diagnosis not present

## 2014-03-29 DIAGNOSIS — Z7251 High risk heterosexual behavior: Secondary | ICD-10-CM

## 2014-03-29 NOTE — Progress Notes (Signed)
   Subjective:    Patient ID: Keith Becker, male    DOB: Sep 11, 1985, 29 y.o.   MRN: [id number]  HPI   Mr. Keith Becker is a 29 year-old [place] who is at extremely high risk for acquiring HIV infection.  He has had well over 100 sexual partners in the past year. He has used condoms with some of these partners with many he has not. Some of these partners of an HIV and others have claims that they have not been HIV infected.   He has finished treatment for syphilis and his RPR is now non-reactive.  We checked HIV RNA at last visit and in the New Year. He had one partner who he is sure is HIV - and had unprotected sex with him since January 4th.  He has been on Truvada and tolerated except for reduced appetite and losing some weight.  He has had two partners since I last saw him one a month ago whose status he did not know and with whom he used protection. The other is becoming his long time partner and is reportedly HIV negative. They have sex every day that is unprotected.    Review of Systems  Constitutional: Negative for fever, chills, diaphoresis, activity change, appetite change, fatigue and unexpected weight change.  HENT: Negative for congestion, rhinorrhea, sinus pressure, sneezing, sore throat and trouble swallowing.   Eyes: Negative for photophobia and visual disturbance.  Respiratory: Negative for cough, chest tightness, shortness of breath, wheezing and stridor.   Cardiovascular: Negative for chest pain, palpitations and leg swelling.  Gastrointestinal: Negative for nausea, vomiting, abdominal pain, diarrhea, constipation, blood in stool, abdominal distention and anal bleeding.  Genitourinary: Negative for dysuria, hematuria, flank pain and difficulty urinating.  Musculoskeletal: Negative for myalgias, back pain, joint swelling, arthralgias and gait problem.  Skin: Negative for color change, pallor, rash and wound.  Neurological: Negative for dizziness, tremors,  weakness and light-headedness.  Hematological: Negative for adenopathy. Does not bruise/bleed easily.  Psychiatric/Behavioral: Negative for behavioral problems, confusion, sleep disturbance, dysphoric mood, decreased concentration and agitation.       Objective:   Physical Exam  Constitutional: He is oriented to person, place, and time. He appears well-developed and well-nourished.  HENT:  Head: Normocephalic and atraumatic.  Eyes: Conjunctivae and EOM are normal.  Neck: Normal range of motion. Neck supple.  Cardiovascular: Normal rate and regular rhythm.   Pulmonary/Chest: Effort normal. No respiratory distress. He has no wheezes.  Abdominal: Soft. He exhibits no distension.  Musculoskeletal: Normal range of motion. He exhibits no edema or tenderness.  Neurological: He is alert and oriented to person, place, and time.  Skin: Skin is warm and dry. No rash noted. No erythema. No pallor.  Psychiatric: He has a normal mood and affect. His behavior is normal. Judgment and thought content normal.          Assessment & Plan:  Z72.5 other problems related to lifestyle, high risk sexual behavior    --Check  HIV RNA today, HIV antibody, RPR, GC and chlamydia, BMP  And send rx for TRUVADA for one mont + 2 refills, RTC in three months and recheck labs including for STDS  Need for hepatitis vaccine: make sure gets series for hep a today and in 6 months    Syphilis: RPR now NR

## 2014-03-30 ENCOUNTER — Telehealth: Payer: Self-pay | Admitting: Infectious Disease

## 2014-03-30 LAB — URINE CYTOLOGY ANCILLARY ONLY
Chlamydia: NEGATIVE
NEISSERIA GONORRHEA: NEGATIVE

## 2014-03-30 LAB — HIV-1 RNA QUANT-NO REFLEX-BLD
HIV 1 RNA Quant: <20 copies/mL (ref <20)
HIV-1 RNA Quant, Log: <1.3 {Log} (ref <1.30)

## 2014-03-30 LAB — HIV ANTIBODY (ROUTINE TESTING W REFLEX): HIV 1&2 Ab, 4th Generation: NONREACTIVE

## 2014-03-30 LAB — RPR

## 2014-03-30 MED ORDER — EMTRICITABINE-TENOFOVIR DF 200-300 MG PO TABS: 1.0000 | ORAL_TABLET | Freq: Every day | ORAL | Status: DC

## 2014-03-30 NOTE — Telephone Encounter (Signed)
Perfect

## 2014-03-30 NOTE — Telephone Encounter (Signed)
Patient notified

## 2014-03-30 NOTE — Telephone Encounter (Signed)
Can we let pt know I have sent new rx for Truvada for PRep

## 2014-06-14 ENCOUNTER — Encounter: Payer: Self-pay | Admitting: Infectious Disease

## 2014-06-14 ENCOUNTER — Ambulatory Visit (INDEPENDENT_AMBULATORY_CARE_PROVIDER_SITE_OTHER): Payer: BLUE CROSS/BLUE SHIELD | Admitting: Infectious Disease

## 2014-06-14 DIAGNOSIS — Z7251 High risk heterosexual behavior: Secondary | ICD-10-CM | POA: Diagnosis not present

## 2014-06-14 LAB — CBC WITH DIFFERENTIAL/PLATELET
Basophils Absolute: 0 10*3/uL (ref 0.0–0.1)
Basophils Relative: 1 % (ref 0–1)
EOS PCT: 2 % (ref 0–5)
Eosinophils Absolute: 0.1 10*3/uL (ref 0.0–0.7)
HEMATOCRIT: 41.6 % (ref 39.0–52.0)
Hemoglobin: 13.1 g/dL (ref 13.0–17.0)
Lymphocytes Relative: 40 % (ref 12–46)
Lymphs Abs: 1.6 10*3/uL (ref 0.7–4.0)
MCH: 23.1 pg — AB (ref 26.0–34.0)
MCHC: 31.5 g/dL (ref 30.0–36.0)
MCV: 73.4 fL — ABNORMAL LOW (ref 78.0–100.0)
MONO ABS: 0.4 10*3/uL (ref 0.1–1.0)
MONOS PCT: 10 % (ref 3–12)
MPV: 9.7 fL (ref 8.6–12.4)
Neutro Abs: 1.9 10*3/uL (ref 1.7–7.7)
Neutrophils Relative %: 47 % (ref 43–77)
Platelets: 259 10*3/uL (ref 150–400)
RBC: 5.67 MIL/uL (ref 4.22–5.81)
RDW: 17.1 % — ABNORMAL HIGH (ref 11.5–15.5)
WBC: 4.1 10*3/uL (ref 4.0–10.5)

## 2014-06-14 LAB — COMPLETE METABOLIC PANEL WITH GFR
ALT: 20 U/L (ref 0–53)
AST: 18 U/L (ref 0–37)
Albumin: 4.3 g/dL (ref 3.5–5.2)
Alkaline Phosphatase: 53 U/L (ref 39–117)
BUN: 13 mg/dL (ref 6–23)
CALCIUM: 9.4 mg/dL (ref 8.4–10.5)
CHLORIDE: 104 meq/L (ref 96–112)
CO2: 25 meq/L (ref 19–32)
Creat: 0.89 mg/dL (ref 0.50–1.35)
GFR, Est African American: >89 mL/min
GFR, Est Non African American: >89 mL/min
GLUCOSE: 68 mg/dL — AB (ref 70–99)
Potassium: 4 mEq/L (ref 3.5–5.3)
SODIUM: 141 meq/L (ref 135–145)
TOTAL PROTEIN: 7.1 g/dL (ref 6.0–8.3)
Total Bilirubin: 0.8 mg/dL (ref 0.2–1.2)

## 2014-06-14 NOTE — Progress Notes (Signed)
   Subjective:    Patient ID: Keith Becker, male    DOB: 04/27/1985, 29 y.o.   MRN: [id number]  HPI   Mr. Keith Becker is a 29 year-old [place] man who is at extremely high risk for acquiring HIV infection.  He has had well over 100 sexual partners year prior to meeting me. He has used condoms with some of these partners and with many he has not. Some of these partners of an HIV and others have claims that they have not been HIV infected.   He has finished treatment for syphilis and his RPR is now non-reactive.  He has been on Truvada and tolerated except for reduced appetite and losing some weight.  Since we last saw he has had sex with largely only one person. That person says they do not have HIV. Sex with this partner is essentially unprotected 100% of the time. Patient is typically a top. Also having oral sex giving and receiving.   He had sex with one other person and did not know their status. Sex was unprotected with that individual.      Review of Systems  Constitutional: Negative for fever, chills, diaphoresis, activity change, appetite change, fatigue and unexpected weight change.  HENT: Negative for congestion, rhinorrhea, sinus pressure, sneezing, sore throat and trouble swallowing.   Eyes: Negative for photophobia and visual disturbance.  Respiratory: Negative for cough, chest tightness, shortness of breath, wheezing and stridor.   Cardiovascular: Negative for chest pain, palpitations and leg swelling.  Gastrointestinal: Negative for nausea, vomiting, abdominal pain, diarrhea, constipation, blood in stool, abdominal distention and anal bleeding.  Genitourinary: Negative for dysuria, hematuria, flank pain and difficulty urinating.  Musculoskeletal: Negative for myalgias, back pain, joint swelling, arthralgias and gait problem.  Skin: Negative for color change, pallor, rash and wound.  Neurological: Negative for dizziness, tremors, weakness and light-headedness.    Hematological: Negative for adenopathy. Does not bruise/bleed easily.  Psychiatric/Behavioral: Negative for behavioral problems, confusion, sleep disturbance, dysphoric mood, decreased concentration and agitation.       Objective:   Physical Exam  Constitutional: He is oriented to person, place, and time. He appears well-developed and well-nourished.  HENT:  Head: Normocephalic and atraumatic.  Eyes: Conjunctivae and EOM are normal.  Neck: Normal range of motion. Neck supple.  Cardiovascular: Normal rate and regular rhythm.   Pulmonary/Chest: Effort normal. No respiratory distress. He has no wheezes.  Abdominal: Soft. He exhibits no distension.  Musculoskeletal: Normal range of motion. He exhibits no edema or tenderness.  Neurological: He is alert and oriented to person, place, and time.  Skin: Skin is warm and dry. No rash noted. No erythema. No pallor.  Psychiatric: He has a normal mood and affect. His behavior is normal. Judgment and thought content normal.          Assessment & Plan:  Z72.5 other problems related to lifestyle, high risk sexual behavior  --Check  HIV RNA today, HIV antibody, RPR, GC and chlamydia in urine and oropharynx  BMP  And send rx for TRUVADA for one month + 2 refills, RTC in three months and recheck labs including for STDS  His partner be interested in preexposure prophylaxis but lacks insurance to come to clinic we should try to enroll him into the HIV prevention trials network [phone]  Need for hepatitis vaccine: make sure gets series for hep a today at  Month mark    Syphilis: RPR now NR

## 2014-06-15 LAB — URINE CYTOLOGY ANCILLARY ONLY
CHLAMYDIA, DNA PROBE: NEGATIVE
NEISSERIA GONORRHEA: NEGATIVE

## 2014-06-15 LAB — CYTOLOGY, (ORAL, ANAL, URETHRAL) ANCILLARY ONLY
CHLAMYDIA, DNA PROBE: NEGATIVE
NEISSERIA GONORRHEA: NEGATIVE

## 2014-06-15 LAB — RPR

## 2014-06-15 LAB — HIV-1 RNA QUANT-NO REFLEX-BLD

## 2014-06-15 LAB — HIV ANTIBODY (ROUTINE TESTING W REFLEX): HIV 1&2 Ab, 4th Generation: NONREACTIVE

## 2014-09-19 ENCOUNTER — Ambulatory Visit: Payer: BLUE CROSS/BLUE SHIELD | Admitting: Infectious Disease

## 2014-09-29 ENCOUNTER — Ambulatory Visit (INDEPENDENT_AMBULATORY_CARE_PROVIDER_SITE_OTHER): Payer: BLUE CROSS/BLUE SHIELD | Admitting: Family Medicine

## 2014-09-29 VITALS — BP 122/72 | HR 97 | Temp 98.8°F | Resp 17 | Ht 71.0 in | Wt 147.0 lb

## 2014-09-29 DIAGNOSIS — R590 Localized enlarged lymph nodes: Secondary | ICD-10-CM

## 2014-09-29 DIAGNOSIS — N451 Epididymitis: Secondary | ICD-10-CM | POA: Diagnosis not present

## 2014-09-29 DIAGNOSIS — Z7251 High risk heterosexual behavior: Secondary | ICD-10-CM | POA: Diagnosis not present

## 2014-09-29 DIAGNOSIS — R599 Enlarged lymph nodes, unspecified: Secondary | ICD-10-CM | POA: Diagnosis not present

## 2014-09-29 MED ORDER — EMTRICITABINE-TENOFOVIR DF 200-300 MG PO TABS: 1.0000 | ORAL_TABLET | Freq: Every day | ORAL | Status: DC

## 2014-09-29 MED ORDER — CEFTRIAXONE SODIUM 1 G IJ SOLR
1.0000 g | Freq: Once | INTRAMUSCULAR | Status: AC
  Administered 2014-09-29: 1 g via INTRAMUSCULAR

## 2014-09-29 MED ORDER — AZITHROMYCIN 250 MG PO TABS: ORAL_TABLET | ORAL | Status: DC

## 2014-09-29 NOTE — Progress Notes (Signed)
Subjective:    Patient ID: Keith Becker, male    DOB: 1985-12-07, 29 y.o.   MRN: [id number]  09/29/2014  Groin Pain; Exposure to STD; and Medication Refill   HPI This 29 y.o. male presents for evaluation of groin pain and STD exposure.   L sided groin pain.  Onset two weeks ago. Swelling in L groin region.  No dysuria, hematuria, penile discharge, penile ulceration.  Has slight sensitivity to testicles R>L.  +Sexually active males; same partner x 8 months.    Similar issue last year and had STD/syphilis; had penile ulcer with syphilis.  History of gonorrhea in past.  Male partners only.  Last STD screening 06/2014.  No perianal itching, burning, pain.  Followed by [facility] for high risk sexual behavior; maintained on Travada for HIV prophylaxis; appointment on 10/17/2014 with [facility]; needs refill of medication/Travada.  Due for Hepatitis A vaccine at next visit.     Review of Systems  Constitutional: Negative for fever, chills, diaphoresis and fatigue.  Gastrointestinal: Negative for nausea, vomiting, abdominal pain and diarrhea.  Genitourinary: Positive for testicular pain. Negative for dysuria, urgency, frequency, hematuria, flank pain, discharge, penile swelling, scrotal swelling, genital sores and penile pain.  Skin: Negative for rash and wound.    Past Medical History  Diagnosis Date  . Asthma   . Seasonal allergies   . Secondary syphilis    Past Surgical History  Procedure Laterality Date  . Tonsillectomy     No Known Allergies  Social History   Social History  . Marital Status: Single    Spouse Name: N/A  . Number of Children: N/A  . Years of Education: N/A   Occupational History  . customer services [employer]   Social History Main Topics  . Smoking status: Former Games developer  . Smokeless tobacco: Not on file  . Alcohol Use: 0.0 oz/week    0 Standard drinks or equivalent per week     Comment: social wine  . Drug Use: 4.00 per week    Special: Marijuana  . Sexual  Activity:    Partners: Male    Birth Control/ Protection: Condom     Comment: SSP   Other Topics Concern  . Not on file   Social History Narrative   Family History  Problem Relation Age of Onset  . Hypertension Mother   . Hypertension Father        Objective:    BP 122/72 mmHg  Pulse 97  Temp(Src) 98.8 F (37.1 C) (Oral)  Resp 17  Ht  (1.803 m)  Wt 147 lb (66.679 kg)  BMI 20.51 kg/m2  SpO2 98% Physical Exam  Constitutional: He is oriented to person, place, and time. He appears well-developed and well-nourished. No distress.  HENT:  Head: Normocephalic and atraumatic.  Eyes: Conjunctivae and EOM are normal. Pupils are equal, round, and reactive to light.  Neck: Normal range of motion. Neck supple. Carotid bruit is not present. No thyromegaly present.  Cardiovascular: Normal rate, regular rhythm, normal heart sounds and intact distal pulses.  Exam reveals no gallop and no friction rub.   No murmur heard. Pulmonary/Chest: Effort normal and breath sounds normal. He has no wheezes. He has no rales.  Abdominal: Soft. Bowel sounds are normal. He exhibits no distension and no mass. There is no tenderness. There is no rebound and no guarding. Hernia confirmed negative in the right inguinal area and confirmed negative in the left inguinal area.  Genitourinary: Penis normal.  Right testis shows tenderness. Right testis shows no mass and no swelling. Left testis shows no mass, no swelling and no tenderness. Circumcised. No penile erythema or penile tenderness. No discharge found.  L inguinal LAD as outlined in figure.  +R scrotum with TTP inferior aspect at epididymis as outlined in figure.  Lymphadenopathy:    He has no cervical adenopathy.       Right: No inguinal adenopathy present.       Left: Inguinal adenopathy present.  Neurological: He is alert and oriented to person, place, and time. No cranial nerve deficit.  Skin: Skin is warm and dry. No rash noted. He is not  diaphoretic. No erythema.  Psychiatric: He has a normal mood and affect. His behavior is normal.  Nursing note and vitals reviewed.  ROCEPHIN 1000MG  IM ADMINISTERED.     Assessment & Plan:   1. LAD (lymphadenopathy), inguinal   2. High risk sexual behavior   3. Right epididymitis     1.  L Inguinal LAD: New.  Associated with possible R>L epididymitis.  Obtain GC/Chlam/RPR/HIV.  Treat empirically with Rocephin and Zitthromax. No evidence of HSV outbreak. 2.  R>L epididymitis: New.  S/p Rocephin in office; rx for Zithromax provided. 3.  High risk sexual behavior: refill of Truvada provided; upcoming appointment with [facility] in two weeks.  Will warrant second Hepatitis A vaccine at that visit.   Meds ordered this encounter  Medications  . cefTRIAXone (ROCEPHIN) injection 1 g    Sig:     Order Specific Question:  Antibiotic Indication:    Answer:  STD  . azithromycin (ZITHROMAX) 250 MG tablet    Sig: 4 tablets po at once    Dispense:  4 tablet    Refill:  0  . emtricitabine-tenofovir (TRUVADA) 200-300 MG per tablet    Sig: Take 1 tablet by mouth daily.    Dispense:  30 tablet    Refill:  0    No Follow-up on file.     Petar Mucci Paulita Fujita, M.D. [facility] [address] [place], [place]  [address] [phone] phone [phone] fax

## 2014-09-30 LAB — HIV ANTIBODY (ROUTINE TESTING W REFLEX): HIV 1&2 Ab, 4th Generation: NONREACTIVE

## 2014-09-30 LAB — RPR

## 2014-10-02 LAB — GC/CHLAMYDIA PROBE AMP
CT PROBE, AMP APTIMA: NEGATIVE
GC Probe RNA: NEGATIVE

## 2014-10-04 ENCOUNTER — Ambulatory Visit: Payer: BLUE CROSS/BLUE SHIELD | Admitting: Infectious Disease

## 2014-10-17 ENCOUNTER — Encounter: Payer: Self-pay | Admitting: Infectious Disease

## 2014-10-17 ENCOUNTER — Ambulatory Visit (INDEPENDENT_AMBULATORY_CARE_PROVIDER_SITE_OTHER): Payer: BLUE CROSS/BLUE SHIELD | Admitting: Infectious Disease

## 2014-10-17 VITALS — BP 105/70 | HR 74 | Temp 98.7°F | Wt 145.0 lb

## 2014-10-17 DIAGNOSIS — A5149 Other secondary syphilitic conditions: Secondary | ICD-10-CM | POA: Diagnosis not present

## 2014-10-17 DIAGNOSIS — Z7251 High risk heterosexual behavior: Secondary | ICD-10-CM

## 2014-10-17 DIAGNOSIS — Z113 Encounter for screening for infections with a predominantly sexual mode of transmission: Secondary | ICD-10-CM

## 2014-10-17 MED ORDER — EMTRICITABINE-TENOFOVIR DF 200-300 MG PO TABS: 1.0000 | ORAL_TABLET | Freq: Every day | ORAL | Status: DC

## 2014-10-17 NOTE — Progress Notes (Signed)
Subjective:    Patient ID: Keith Becker, male    DOB: 09/04/1985, 29 y.o.   MRN: [id number]  HPI   Mr. Graffam is a 29 year-old [place] American man who is at extremely high risk for acquiring HIV infection.  He has had well over 100 sexual partners year prior to meeting me at his first visit. He had used condoms with some of these partners and with many he has not. Some of these partners of an HIV and others have claims that they have not been HIV infected.   He has finished treatment for syphilis and his RPR is now non-reactive.  He has been on Truvada and tolerated it now fairly well.  At last visit he reported sex with largely only one person. That person says they do not have HIV. Sex with this partner is essentially unprotected 100% of the time. Patient is typically a top. Also having oral sex giving and receiving.   He had sex also prior to that visit with one other person and did not know their status. Sex was unprotected with that individual.   Since the last visit, he has been monogamous with his one same partner. This patient has said he is HIV-. Sex has been insertive and receptive, also some oral sex. He came to ED recently with inguinal LA and was tested in urine for GC and chlamydia and treated empirically for GC , chlamydia with rocephin IM and oral azithromycin. RPR was NR. HIV repeat was negative.. LN has resolved.  Past Medical History  Diagnosis Date  . Asthma   . Seasonal allergies   . Secondary syphilis     Past Surgical History  Procedure Laterality Date  . Tonsillectomy      Social History:  reports that he has quit smoking. He does not have any smokeless tobacco history on file. He reports that he drinks alcohol. He reports that he uses illicit drugs (Marijuana) about 4 times per week.   Family History  Problem Relation Age of Onset  . Hypertension Mother   . Hypertension Father     No Known Allergies   Current outpatient prescriptions:  .   emtricitabine-tenofovir (TRUVADA) 200-300 MG per tablet, Take 1 tablet by mouth daily., Disp: 30 tablet, Rfl: 2      Review of Systems  Constitutional: Negative for fever, chills, diaphoresis, activity change, appetite change, fatigue and unexpected weight change.  HENT: Negative for congestion, rhinorrhea, sinus pressure, sneezing, sore throat and trouble swallowing.   Eyes: Negative for photophobia and visual disturbance.  Respiratory: Negative for cough, chest tightness, shortness of breath, wheezing and stridor.   Cardiovascular: Negative for chest pain, palpitations and leg swelling.  Gastrointestinal: Negative for nausea, vomiting, abdominal pain, diarrhea, constipation, blood in stool, abdominal distention and anal bleeding.  Genitourinary: Negative for dysuria, hematuria, flank pain and difficulty urinating.  Musculoskeletal: Negative for myalgias, back pain, joint swelling, arthralgias and gait problem.  Skin: Negative for color change, pallor, rash and wound.  Neurological: Negative for dizziness, tremors, weakness and light-headedness.  Hematological: Positive for adenopathy. Does not bruise/bleed easily.  Psychiatric/Behavioral: Negative for behavioral problems, confusion, sleep disturbance, dysphoric mood, decreased concentration and agitation.       Objective:   Physical Exam  Constitutional: He is oriented to person, place, and time. He appears well-developed and well-nourished.  HENT:  Head: Normocephalic and atraumatic.  Eyes: Conjunctivae and EOM are normal.  Neck: Normal range of motion. Neck supple.  Cardiovascular: Normal rate and  regular rhythm.   Pulmonary/Chest: Effort normal. No respiratory distress. He has no wheezes.  Abdominal: Soft. He exhibits no distension.  Musculoskeletal: Normal range of motion. He exhibits no edema.  Neurological: He is alert and oriented to person, place, and time.  Skin: Skin is warm and dry. No rash noted. No erythema. No  pallor.  Psychiatric: He has a normal mood and affect. His behavior is normal. Judgment and thought content normal.  Nursing note and vitals reviewed.         Assessment & Plan:  Z72.5 other problems related to lifestyle, high risk sexual behavior  He has been recently tested in ED prior to this visit and presumptively rx for possible GC and chlamydia. Will renew his Truvada with 2 refills and RTC in 3 months  Syphilis: RPR NR

## 2015-01-16 ENCOUNTER — Ambulatory Visit: Payer: BLUE CROSS/BLUE SHIELD | Admitting: Infectious Disease

## 2015-03-11 ENCOUNTER — Encounter (INDEPENDENT_AMBULATORY_CARE_PROVIDER_SITE_OTHER): Payer: BLUE CROSS/BLUE SHIELD | Admitting: *Deleted

## 2015-03-11 ENCOUNTER — Ambulatory Visit (INDEPENDENT_AMBULATORY_CARE_PROVIDER_SITE_OTHER): Payer: BLUE CROSS/BLUE SHIELD | Admitting: Infectious Disease

## 2015-03-11 ENCOUNTER — Encounter: Payer: Self-pay | Admitting: Infectious Disease

## 2015-03-11 ENCOUNTER — Ambulatory Visit: Payer: BLUE CROSS/BLUE SHIELD | Admitting: Infectious Disease

## 2015-03-11 ENCOUNTER — Other Ambulatory Visit: Payer: Self-pay | Admitting: Infectious Disease

## 2015-03-11 VITALS — BP 127/74 | HR 58 | Temp 98.1°F | Ht 70.5 in | Wt 159.0 lb

## 2015-03-11 DIAGNOSIS — Z7251 High risk heterosexual behavior: Secondary | ICD-10-CM

## 2015-03-11 DIAGNOSIS — Z0000 Encounter for general adult medical examination without abnormal findings: Secondary | ICD-10-CM

## 2015-03-11 DIAGNOSIS — Z006 Encounter for examination for normal comparison and control in clinical research program: Secondary | ICD-10-CM

## 2015-03-11 DIAGNOSIS — Z113 Encounter for screening for infections with a predominantly sexual mode of transmission: Secondary | ICD-10-CM

## 2015-03-11 LAB — CBC WITH DIFFERENTIAL/PLATELET
Basophils Absolute: 0 10*3/uL (ref 0.0–0.1)
Basophils Relative: 0 % (ref 0–1)
EOS ABS: 0.2 10*3/uL (ref 0.0–0.7)
EOS PCT: 4 % (ref 0–5)
HCT: 39.2 % (ref 39.0–52.0)
Hemoglobin: 12.5 g/dL — ABNORMAL LOW (ref 13.0–17.0)
LYMPHS ABS: 1.8 10*3/uL (ref 0.7–4.0)
Lymphocytes Relative: 36 % (ref 12–46)
MCH: 23 pg — AB (ref 26.0–34.0)
MCHC: 31.9 g/dL (ref 30.0–36.0)
MCV: 72.2 fL — AB (ref 78.0–100.0)
MONOS PCT: 9 % (ref 3–12)
MPV: 9.8 fL (ref 8.6–12.4)
Monocytes Absolute: 0.5 10*3/uL (ref 0.1–1.0)
Neutro Abs: 2.6 10*3/uL (ref 1.7–7.7)
Neutrophils Relative %: 51 % (ref 43–77)
PLATELETS: 219 10*3/uL (ref 150–400)
RBC: 5.43 MIL/uL (ref 4.22–5.81)
RDW: 15.4 % (ref 11.5–15.5)
WBC: 5 10*3/uL (ref 4.0–10.5)

## 2015-03-11 LAB — COMPREHENSIVE METABOLIC PANEL
ALT: 13 U/L (ref 9–46)
AST: 13 U/L (ref 10–40)
Albumin: 4.2 g/dL (ref 3.6–5.1)
Alkaline Phosphatase: 58 U/L (ref 40–115)
BUN: 7 mg/dL (ref 7–25)
CHLORIDE: 103 mmol/L (ref 98–110)
CO2: 30 mmol/L (ref 20–31)
Calcium: 9.5 mg/dL (ref 8.6–10.3)
Creat: 0.76 mg/dL (ref 0.60–1.35)
GLUCOSE: 94 mg/dL (ref 65–99)
POTASSIUM: 4.1 mmol/L (ref 3.5–5.3)
Sodium: 139 mmol/L (ref 135–146)
TOTAL PROTEIN: 6.9 g/dL (ref 6.1–8.1)
Total Bilirubin: 0.6 mg/dL (ref 0.2–1.2)

## 2015-03-11 LAB — HIV ANTIBODY (ROUTINE TESTING W REFLEX): HIV 1&2 Ab, 4th Generation: NONREACTIVE

## 2015-03-11 LAB — HEPATITIS C ANTIBODY: HCV Ab: NEGATIVE

## 2015-03-11 LAB — HEPATITIS B SURFACE ANTIGEN: Hepatitis B Surface Ag: NEGATIVE

## 2015-03-11 NOTE — Progress Notes (Signed)
Chief complaint follow-up for "high-risk sexual behavior, problems related to lifestyle,  need for PrEP   Subjective:    Patient ID: Keith Becker, male    DOB: 1985-06-06, 30 y.o.   MRN: [id number]  HPI   [name] is a 30 year-old [place] American man who has been at  extremely high risk for acquiring HIV infection.  When I initially met him he had well over 100 sexual partners year prior to meeting me at his first visit. He had used condoms with some of these partners and with many he has not. Some of these partners of an HIV and others have claims that they have not been HIV infected.   He has finished treatment for syphilis in 2015 and his RPR is now non-reactive.  He has been on Truvada and tolerated it now fairly well.   He came to ED in past year with inguinal LA and was tested in urine for GC and chlamydia and treated empirically for GC , chlamydia with rocephin IM and oral azithromycin. RPR was NR. HIV repeat was negative.. LN has resolved. He did not have rectal or oral samples taken.  Since we last saw Keith Becker:He was active with largely 2 partners both allegedly HIV -. No condoms. He was insertive "top" not a bottom. Oral sex- giving and receiving.  Over the weekend he was drinking, having sex with 3 partners. 2 of the partners he knew and are the ones mentioned above. One is unknown.   He states he is not using condoms with any form of sexual intercourse recently.  He says he has been highly adherent to his Truvada  IN review of his sexual history over the past 6 months he says he has had well over [place] partners.  Based on number of sexual partners is clear to me that he would be an ideal candidate for the [facility] Study and I had him meet with Eliezer Champagne re screening for that study.  Past Medical History  Diagnosis Date  . Asthma   . Seasonal allergies   . Secondary syphilis     Past Surgical History  Procedure Laterality Date  . Tonsillectomy       Social History:  reports that he has quit smoking. He does not have any smokeless tobacco history on file. He reports that he drinks alcohol. He reports that he uses illicit drugs (Marijuana) about 4 times per week.   Family History  Problem Relation Age of Onset  . Hypertension Mother   . Hypertension Father     No Known Allergies   Current outpatient prescriptions:  .  emtricitabine-tenofovir (TRUVADA) 200-300 MG per tablet, Take 1 tablet by mouth daily., Disp: 30 tablet, Rfl: 2      Review of Systems  Constitutional: Negative for fever, chills, diaphoresis, activity change, appetite change, fatigue and unexpected weight change.  HENT: Negative for congestion, rhinorrhea, sinus pressure, sneezing, sore throat and trouble swallowing.   Eyes: Negative for photophobia and visual disturbance.  Respiratory: Negative for cough, chest tightness, shortness of breath, wheezing and stridor.   Cardiovascular: Negative for chest pain, palpitations and leg swelling.  Gastrointestinal: Negative for nausea, vomiting, abdominal pain, diarrhea, constipation, blood in stool, abdominal distention and anal bleeding.  Genitourinary: Negative for dysuria, hematuria, flank pain and difficulty urinating.  Musculoskeletal: Negative for myalgias, back pain, joint swelling, arthralgias and gait problem.  Skin: Negative for color change, pallor, rash and wound.  Neurological: Negative for dizziness, tremors, weakness and light-headedness.  Hematological: Negative for adenopathy. Does not bruise/bleed easily.  Psychiatric/Behavioral: Negative for behavioral problems, confusion, sleep disturbance, dysphoric mood, decreased concentration and agitation.       Objective:   Physical Exam  Constitutional: He is oriented to person, place, and time. He appears well-developed and well-nourished.  HENT:  Head: Normocephalic and atraumatic.  Eyes: Conjunctivae and EOM are normal. Right eye exhibits no  discharge. Left eye exhibits no discharge. No scleral icterus.  Neck: Normal range of motion. Neck supple. No JVD present. No tracheal deviation present.  Cardiovascular: Normal rate, regular rhythm and normal heart sounds.  Exam reveals no gallop and no friction rub.   No murmur heard. Pulmonary/Chest: Effort normal and breath sounds normal. No respiratory distress. He has no wheezes. He has no rales. He exhibits no tenderness.  Abdominal: Soft. Bowel sounds are normal. He exhibits no distension and no mass. There is no tenderness. There is no rebound and no guarding.  Musculoskeletal: Normal range of motion. He exhibits no edema.  Lymphadenopathy:    He has no cervical adenopathy.  Neurological: He is alert and oriented to person, place, and time.  Skin: Skin is warm and dry. No rash noted. No erythema. No pallor.  Psychiatric: He has a normal mood and affect. His behavior is normal. Judgment and thought content normal.  Nursing note and vitals reviewed.         Assessment & Plan:  Z72.5 other problems related to lifestyle, high risk sexual behavior ie need for PrEP to prevent HIV  Based purely on number of partners > 5 over past year he would qualify for [place] 083 study We will get study entry labs today. Today's visit will serve as his physical exam and therefore no charge for visit  Syphilis: RPR NR  Need for HPV vaccination: will revisit in future

## 2015-03-11 NOTE — Progress Notes (Signed)
Study: A Phase 2b/3 Double Blind Safety and Efficacy Study of Injectable Cabotegravir compared to Daily Oral Tenofovir Disoproxil Fumarate/Emtricitabine (TDF/FTC), For Pre-Exposure Prophylaxis in HIV-Uninfected Cisgender Men and Transgender Women who have sex with Men.  Medication: Investigational Injectable Cabotegravir/placebo compared to Truvada/placebo. Duration: Around 4 years.  Keith Becker is here for ZOXW960 screening visit. After verifying the correct version I explained/reviewed the informed consent in the language that he understood. Risk, benefits, responsibilities, and other options were reviewed. I answered his questions. Comprehension was assessed. He was given adequate time to consider his options. He verbalized understanding and signed the consent witnessed by me. I then gave him a copy of the consent.  HIV counseling was given including description of the testing and how it is done; explained HIV and how it is spread and ways to prevent it; Discussed the meaning of the possible test results and what impact the test results may have on the participant. PTID assigned. Confirmation of eligibility for screening was confirmed. Blood drawn at 11:15am. Medical history, medications, bleeding history, and signs/symptoms were reviewed. ECG was obtained. QTcB = 369 ms.  Complete PE performed by Dr. Daiva Eves and vitals obtained at this same day visit. He received $50 gift card for screening visit. If deemed eligible and he is willing to participant in the study then anticipated entry visit is scheduled for Wednesday, March 20, 2015 @ 8:30am. Tacey Heap RN    HIV Rapid results = Negative

## 2015-03-11 NOTE — Patient Instructions (Signed)
We are looking to enroll you into [facility] Study if eligible for the study you can simply come to see Tania Ade and our Research Group Overseen by me for PrEP meds

## 2015-03-12 LAB — HIV-1 RNA QUANT-NO REFLEX-BLD

## 2015-03-12 LAB — CYTOLOGY, (ORAL, ANAL, URETHRAL) ANCILLARY ONLY
CHLAMYDIA, DNA PROBE: NEGATIVE
Neisseria Gonorrhea: NEGATIVE

## 2015-03-20 ENCOUNTER — Encounter: Payer: Self-pay | Admitting: *Deleted

## 2015-03-20 ENCOUNTER — Encounter (INDEPENDENT_AMBULATORY_CARE_PROVIDER_SITE_OTHER): Payer: BLUE CROSS/BLUE SHIELD | Admitting: *Deleted

## 2015-03-20 VITALS — BP 120/75 | HR 64 | Temp 99.0°F | Resp 16 | Wt 158.0 lb

## 2015-03-20 DIAGNOSIS — Z006 Encounter for examination for normal comparison and control in clinical research program: Secondary | ICD-10-CM

## 2015-03-20 LAB — CK: Total CK: 278 U/L — ABNORMAL HIGH (ref 7–232)

## 2015-03-20 LAB — CBC WITH DIFFERENTIAL/PLATELET
BASOS PCT: 1 % (ref 0–1)
Basophils Absolute: 0.1 10*3/uL (ref 0.0–0.1)
EOS ABS: 0.1 10*3/uL (ref 0.0–0.7)
Eosinophils Relative: 2 % (ref 0–5)
HEMATOCRIT: 40.4 % (ref 39.0–52.0)
HEMOGLOBIN: 12.7 g/dL — AB (ref 13.0–17.0)
Lymphocytes Relative: 34 % (ref 12–46)
Lymphs Abs: 2.2 10*3/uL (ref 0.7–4.0)
MCH: 22.8 pg — ABNORMAL LOW (ref 26.0–34.0)
MCHC: 31.4 g/dL (ref 30.0–36.0)
MCV: 72.5 fL — ABNORMAL LOW (ref 78.0–100.0)
MONO ABS: 0.8 10*3/uL (ref 0.1–1.0)
MPV: 9.8 fL (ref 8.6–12.4)
Monocytes Relative: 12 % (ref 3–12)
NEUTROS ABS: 3.3 10*3/uL (ref 1.7–7.7)
Neutrophils Relative %: 51 % (ref 43–77)
Platelets: 257 10*3/uL (ref 150–400)
RBC: 5.57 MIL/uL (ref 4.22–5.81)
RDW: 15.7 % — AB (ref 11.5–15.5)
WBC: 6.4 10*3/uL (ref 4.0–10.5)

## 2015-03-20 LAB — LIPASE: Lipase: 6 U/L — ABNORMAL LOW (ref 7–60)

## 2015-03-20 LAB — LIPID PANEL
Cholesterol: 119 mg/dL — ABNORMAL LOW (ref 125–200)
HDL: 43 mg/dL (ref >=40)
LDL CALC: 68 mg/dL (ref <130)
TRIGLYCERIDES: 41 mg/dL (ref <150)
Total CHOL/HDL Ratio: 2.8 Ratio (ref <=5.0)
VLDL: 8 mg/dL (ref <30)

## 2015-03-20 LAB — POCT URINALYSIS DIPSTICK
Bilirubin, UA: NEGATIVE
Blood, UA: NEGATIVE
Glucose, UA: NEGATIVE
Ketones, UA: NEGATIVE
Leukocytes, UA: NEGATIVE
Nitrite, UA: NEGATIVE
Spec Grav, UA: 1.02
Urobilinogen, UA: 1
pH, UA: 7

## 2015-03-20 LAB — COMPREHENSIVE METABOLIC PANEL
ALK PHOS: 64 U/L (ref 40–115)
ALT: 22 U/L (ref 9–46)
AST: 18 U/L (ref 10–40)
Albumin: 4.2 g/dL (ref 3.6–5.1)
BILIRUBIN TOTAL: 0.5 mg/dL (ref 0.2–1.2)
BUN: 10 mg/dL (ref 7–25)
CALCIUM: 9.8 mg/dL (ref 8.6–10.3)
CO2: 27 mmol/L (ref 20–31)
Chloride: 103 mmol/L (ref 98–110)
Creat: 0.82 mg/dL (ref 0.60–1.35)
Glucose, Bld: 82 mg/dL (ref 65–99)
POTASSIUM: 4.3 mmol/L (ref 3.5–5.3)
Sodium: 140 mmol/L (ref 135–146)
TOTAL PROTEIN: 7.6 g/dL (ref 6.1–8.1)

## 2015-03-20 LAB — PHOSPHORUS: Phosphorus: 3.5 mg/dL (ref 2.5–4.5)

## 2015-03-20 LAB — ALT: ALT: 22 U/L (ref 9–46)

## 2015-03-20 LAB — AMYLASE: Amylase: 81 U/L (ref 0–105)

## 2015-03-20 MED ORDER — STUDY - INVESTIGATIONAL DRUG SIMPLE RECORD: 500.0000 mg | Freq: Every day | Status: DC

## 2015-03-20 MED ORDER — STUDY - INVESTIGATIONAL DRUG SIMPLE RECORD: Status: DC

## 2015-03-20 NOTE — Progress Notes (Signed)
Study: A Phase 2b/3 Double Blind Safety and Efficacy Study of Injectable Cabotegravir compared to Daily Oral Tenofovir Disoproxil Fumarate/Emtricitabine (TDF/FTC), For Pre-Exposure Prophylaxis in HIV-Uninfected Cisgender Men and Transgender Women who have sex with Men.  Medication: Investigational Injectable Cabotegravir/placebo compared to Truvada/placebo. Duration: Around 4 years.   Keith Becker is here for entry visit. After confirming his willingness to enter study I randomized him to the study. We reviewed s&s, and medications. Questionnaires were completed. Vitals stable. Blood drawn with no problems. HIV rapid was NR. Study medication was dispensed and we discussed how to properly administer, potential side effects, and how to store medications. I answered his questions and he verbalized that he will stop taking his Truvada from home and start taking the study medications. I gave him my information if he had any questions or any side effects. He received $50 gift card for visit. Next appointment scheduled for 04/02/2015 @ 10:30 am. Tacey Heap RN

## 2015-03-20 NOTE — Addendum Note (Signed)
Addended by: Nicolasa Ducking on: 03/20/2015 11:26 AM   Modules accepted: Orders, Medications

## 2015-03-21 LAB — HEPATITIS B SURFACE ANTIBODY,QUALITATIVE: HEP B S AB: POSITIVE — AB

## 2015-03-21 LAB — RPR

## 2015-03-21 LAB — HIV ANTIBODY (ROUTINE TESTING W REFLEX): HIV: NONREACTIVE

## 2015-03-21 LAB — HEPATITIS B CORE ANTIBODY, TOTAL: Hep B Core Total Ab: NONREACTIVE

## 2015-03-21 LAB — GC/CHLAMYDIA PROBE AMP
CT PROBE, AMP APTIMA: NOT DETECTED
GC PROBE AMP APTIMA: NOT DETECTED

## 2015-03-25 LAB — CT/NG RNA, TMA RECTAL
CHLAMYDIA TRACHOMATIS RNA: NOT DETECTED
NEISSERIA GONORRHOEAE RNA: NOT DETECTED

## 2015-04-02 ENCOUNTER — Encounter (INDEPENDENT_AMBULATORY_CARE_PROVIDER_SITE_OTHER): Payer: Self-pay | Admitting: *Deleted

## 2015-04-02 VITALS — BP 116/74 | HR 58 | Temp 99.0°F | Resp 16 | Wt 162.8 lb

## 2015-04-02 DIAGNOSIS — Z006 Encounter for examination for normal comparison and control in clinical research program: Secondary | ICD-10-CM

## 2015-04-02 LAB — COMPREHENSIVE METABOLIC PANEL
ALBUMIN: 4.2 g/dL (ref 3.6–5.1)
ALK PHOS: 62 U/L (ref 40–115)
ALT: 17 U/L (ref 9–46)
AST: 15 U/L (ref 10–40)
BILIRUBIN TOTAL: 0.5 mg/dL (ref 0.2–1.2)
BUN: 13 mg/dL (ref 7–25)
CALCIUM: 9.5 mg/dL (ref 8.6–10.3)
CO2: 29 mmol/L (ref 20–31)
Chloride: 104 mmol/L (ref 98–110)
Creat: 0.81 mg/dL (ref 0.60–1.35)
GLUCOSE: 98 mg/dL (ref 65–99)
POTASSIUM: 4.2 mmol/L (ref 3.5–5.3)
Sodium: 139 mmol/L (ref 135–146)
TOTAL PROTEIN: 7.5 g/dL (ref 6.1–8.1)

## 2015-04-02 LAB — CBC WITH DIFFERENTIAL/PLATELET
BASOS ABS: 0 10*3/uL (ref 0.0–0.1)
Basophils Relative: 1 % (ref 0–1)
Eosinophils Absolute: 0.1 10*3/uL (ref 0.0–0.7)
Eosinophils Relative: 3 % (ref 0–5)
HEMATOCRIT: 37.9 % — AB (ref 39.0–52.0)
Hemoglobin: 11.8 g/dL — ABNORMAL LOW (ref 13.0–17.0)
LYMPHS ABS: 2 10*3/uL (ref 0.7–4.0)
LYMPHS PCT: 51 % — AB (ref 12–46)
MCH: 22.8 pg — ABNORMAL LOW (ref 26.0–34.0)
MCHC: 31.1 g/dL (ref 30.0–36.0)
MCV: 73.3 fL — ABNORMAL LOW (ref 78.0–100.0)
MPV: 9.5 fL (ref 8.6–12.4)
Monocytes Absolute: 0.4 10*3/uL (ref 0.1–1.0)
Monocytes Relative: 11 % (ref 3–12)
NEUTROS ABS: 1.3 10*3/uL — AB (ref 1.7–7.7)
NEUTROS PCT: 34 % — AB (ref 43–77)
PLATELETS: 265 10*3/uL (ref 150–400)
RBC: 5.17 MIL/uL (ref 4.22–5.81)
RDW: 16.1 % — AB (ref 11.5–15.5)
WBC: 3.9 10*3/uL — ABNORMAL LOW (ref 4.0–10.5)

## 2015-04-02 LAB — AMYLASE: AMYLASE: 121 U/L — AB (ref 0–105)

## 2015-04-02 LAB — LIPASE: LIPASE: 8 U/L (ref 7–60)

## 2015-04-02 LAB — CK: CK TOTAL: 101 U/L (ref 7–232)

## 2015-04-02 LAB — PHOSPHORUS: PHOSPHORUS: 3.3 mg/dL (ref 2.5–4.5)

## 2015-04-02 NOTE — Progress Notes (Signed)
Xzavian is here for ZOXW960, week 2 visit. He has been adherent to his regimen. Pill count performed and #46 remain of each. Blood draw obtained with no problems. 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 He has been having soft stools that begun one week after taking medication but does not affect his ADLs. He received $50 giftcard and declined condoms. Will see him in 2 weeks. Tacey Heap RN  HIV Rapid - Nonreactive

## 2015-04-03 LAB — HIV ANTIBODY (ROUTINE TESTING W REFLEX): HIV 1&2 Ab, 4th Generation: NONREACTIVE

## 2015-04-16 ENCOUNTER — Encounter (INDEPENDENT_AMBULATORY_CARE_PROVIDER_SITE_OTHER): Payer: BLUE CROSS/BLUE SHIELD | Admitting: *Deleted

## 2015-04-16 VITALS — BP 104/67 | HR 56 | Temp 98.1°F | Resp 17 | Wt 163.0 lb

## 2015-04-16 DIAGNOSIS — Z006 Encounter for examination for normal comparison and control in clinical research program: Secondary | ICD-10-CM

## 2015-04-16 LAB — COMPREHENSIVE METABOLIC PANEL
ALBUMIN: 3.9 g/dL (ref 3.6–5.1)
ALK PHOS: 50 U/L (ref 40–115)
ALT: 16 U/L (ref 9–46)
AST: 17 U/L (ref 10–40)
BILIRUBIN TOTAL: 0.5 mg/dL (ref 0.2–1.2)
BUN: 12 mg/dL (ref 7–25)
CO2: 25 mmol/L (ref 20–31)
CREATININE: 0.81 mg/dL (ref 0.60–1.35)
Calcium: 9 mg/dL (ref 8.6–10.3)
Chloride: 106 mmol/L (ref 98–110)
Glucose, Bld: 77 mg/dL (ref 65–99)
Potassium: 4.2 mmol/L (ref 3.5–5.3)
SODIUM: 139 mmol/L (ref 135–146)
TOTAL PROTEIN: 6.5 g/dL (ref 6.1–8.1)

## 2015-04-16 LAB — CBC WITH DIFFERENTIAL/PLATELET
Basophils Absolute: 0 10*3/uL (ref 0.0–0.1)
Basophils Relative: 1 % (ref 0–1)
EOS ABS: 0.1 10*3/uL (ref 0.0–0.7)
EOS PCT: 3 % (ref 0–5)
HEMATOCRIT: 36.7 % — AB (ref 39.0–52.0)
Hemoglobin: 11.7 g/dL — ABNORMAL LOW (ref 13.0–17.0)
LYMPHS ABS: 1.2 10*3/uL (ref 0.7–4.0)
LYMPHS PCT: 39 % (ref 12–46)
MCH: 23 pg — AB (ref 26.0–34.0)
MCHC: 31.9 g/dL (ref 30.0–36.0)
MCV: 72.2 fL — ABNORMAL LOW (ref 78.0–100.0)
MONOS PCT: 11 % (ref 3–12)
MPV: 10 fL (ref 8.6–12.4)
Monocytes Absolute: 0.4 10*3/uL (ref 0.1–1.0)
NEUTROS PCT: 46 % (ref 43–77)
Neutro Abs: 1.5 10*3/uL — ABNORMAL LOW (ref 1.7–7.7)
Platelets: 185 10*3/uL (ref 150–400)
RBC: 5.08 MIL/uL (ref 4.22–5.81)
RDW: 16.1 % — ABNORMAL HIGH (ref 11.5–15.5)
WBC: 3.2 10*3/uL — ABNORMAL LOW (ref 4.0–10.5)

## 2015-04-16 LAB — LIPASE: LIPASE: 10 U/L (ref 7–60)

## 2015-04-16 LAB — PHOSPHORUS: Phosphorus: 4 mg/dL (ref 2.5–4.5)

## 2015-04-16 LAB — CK: CK TOTAL: 164 U/L (ref 7–232)

## 2015-04-16 LAB — AMYLASE: AMYLASE: 101 U/L (ref 0–105)

## 2015-04-16 NOTE — Progress Notes (Signed)
Study: A Phase 2b/3 Double Blind Safety and Efficacy Study of Injectable Cabotegravir compared to Daily Oral Tenofovir Disoproxil Fumarate/Emtricitabine (TDF/FTC), For Pre-Exposure Prophylaxis in HIV-Uninfected Cisgender Men and Transgender Women who have sex with Men.  Medication: Investigational Injectable Cabotegravir/placebo compared to Truvada/placebo. Duration: Around 4 years.  Keith Becker is here for week 4. He did have a day of groin pain and took an aleve and all is resolved. Continues to have loose stools but mainly one time per day and rarely twice per day. He feels that it is actually a better bowel schedule then before. Prior to drawing his blood I reviewed with him the HIV rapid test and how it is done. I reviewed how HIV infection is spread and ways to prevent it. I reviewed the possible test results, what they mean, and how he feels it may impact him. I assessed his comprehension and then obtained his blood with no problems. Vital signs are stable; No changes to medications. Pill count performed and #33 remain for each study drug. His adherence is 100%. He denies having any difficulty adhering to his regimen. He denies having any social impacts and his locator information has not changed. He has immunity to Hep B. He met with Joetta Manners [facility] Educator) to discuss adherence. HIV Rapid results = Nonreactive. He received $50 gift card for visit. Will see him Next Thursday for his week 5 visit if labs reflect that he can move to step 2. Eliezer Champagne RN

## 2015-04-17 LAB — HIV ANTIBODY (ROUTINE TESTING W REFLEX): HIV 1&2 Ab, 4th Generation: NONREACTIVE

## 2015-04-24 ENCOUNTER — Encounter (INDEPENDENT_AMBULATORY_CARE_PROVIDER_SITE_OTHER): Payer: Self-pay | Admitting: *Deleted

## 2015-04-24 ENCOUNTER — Other Ambulatory Visit: Payer: Self-pay | Admitting: *Deleted

## 2015-04-24 VITALS — BP 105/63 | HR 79 | Temp 97.9°F | Resp 16 | Wt 162.5 lb

## 2015-04-24 DIAGNOSIS — Z006 Encounter for examination for normal comparison and control in clinical research program: Secondary | ICD-10-CM

## 2015-04-24 NOTE — Progress Notes (Signed)
Study: A Phase 2b/3 Double Blind Safety and Efficacy Study of Injectable Cabotegravir compared to Daily Oral Tenofovir Disoproxil Fumarate/Emtricitabine (TDF/FTC), For Pre-Exposure Prophylaxis in HIV-Uninfected Cisgender Men and Transgender Women who have sex with Men.  Medication: Investigational Injectable Cabotegravir/placebo compared to Truvada/placebo. Duration: Around 4 years.  Eriq is here for week 5. He has no new signs, symptoms, or medications. Blood drawn and HIV rapid is negative. Pill count performed and #25 remain which means he has been 100% adherent. Returned TDF/FTC to him and returned oral CAB to pharmacy. Questionnaires and interviews completed. Vitals are stable. Injection given in RUQ of Gluteal muscle. No issues with injection. He received $50 giftcard for visit and will return on Tuesda for a follow up. Tacey HeapElisha Epperson RN

## 2015-04-25 LAB — HIV ANTIBODY (ROUTINE TESTING W REFLEX): HIV: NONREACTIVE

## 2015-04-30 ENCOUNTER — Encounter (INDEPENDENT_AMBULATORY_CARE_PROVIDER_SITE_OTHER): Payer: BLUE CROSS/BLUE SHIELD | Admitting: *Deleted

## 2015-04-30 ENCOUNTER — Encounter (INDEPENDENT_AMBULATORY_CARE_PROVIDER_SITE_OTHER): Payer: Self-pay

## 2015-04-30 VITALS — BP 120/76 | HR 51 | Temp 98.5°F | Resp 16 | Wt 158.0 lb

## 2015-04-30 DIAGNOSIS — Z006 Encounter for examination for normal comparison and control in clinical research program: Secondary | ICD-10-CM

## 2015-04-30 LAB — CBC WITH DIFFERENTIAL/PLATELET
Basophils Absolute: 0 10*3/uL (ref 0.0–0.1)
Basophils Relative: 1 % (ref 0–1)
Eosinophils Absolute: 0 10*3/uL (ref 0.0–0.7)
Eosinophils Relative: 1 % (ref 0–5)
HEMATOCRIT: 41.1 % (ref 39.0–52.0)
HEMOGLOBIN: 14.6 g/dL (ref 13.0–17.0)
Lymphocytes Relative: 29 % (ref 12–46)
Lymphs Abs: 1.4 10*3/uL (ref 0.7–4.0)
MCH: 32.8 pg (ref 26.0–34.0)
MCHC: 35.5 g/dL (ref 30.0–36.0)
MCV: 92.4 fL (ref 78.0–100.0)
MPV: 8.3 fL — AB (ref 8.6–12.4)
Monocytes Absolute: 0.6 10*3/uL (ref 0.1–1.0)
Monocytes Relative: 13 % — ABNORMAL HIGH (ref 3–12)
NEUTROS PCT: 56 % (ref 43–77)
Neutro Abs: 2.6 10*3/uL (ref 1.7–7.7)
Platelets: 234 10*3/uL (ref 150–400)
RBC: 4.45 MIL/uL (ref 4.22–5.81)
RDW: 13.4 % (ref 11.5–15.5)
WBC: 4.7 10*3/uL (ref 4.0–10.5)

## 2015-04-30 LAB — COMPREHENSIVE METABOLIC PANEL
ALBUMIN: 4.4 g/dL (ref 3.6–5.1)
ALT: 14 U/L (ref 9–46)
AST: 15 U/L (ref 10–40)
Alkaline Phosphatase: 60 U/L (ref 40–115)
BILIRUBIN TOTAL: 0.9 mg/dL (ref 0.2–1.2)
BUN: 10 mg/dL (ref 7–25)
CALCIUM: 9.8 mg/dL (ref 8.6–10.3)
CHLORIDE: 105 mmol/L (ref 98–110)
CO2: 24 mmol/L (ref 20–31)
CREATININE: 0.88 mg/dL (ref 0.60–1.35)
Glucose, Bld: 79 mg/dL (ref 65–99)
Potassium: 4 mmol/L (ref 3.5–5.3)
SODIUM: 140 mmol/L (ref 135–146)
TOTAL PROTEIN: 7.4 g/dL (ref 6.1–8.1)

## 2015-04-30 LAB — CK: Total CK: 145 U/L (ref 7–232)

## 2015-04-30 LAB — PHOSPHORUS: PHOSPHORUS: 4.4 mg/dL (ref 2.5–4.5)

## 2015-04-30 LAB — HIV ANTIBODY (ROUTINE TESTING W REFLEX): HIV: NONREACTIVE

## 2015-04-30 LAB — AMYLASE: AMYLASE: 80 U/L (ref 0–105)

## 2015-04-30 LAB — LIPASE: LIPASE: 6 U/L — AB (ref 7–60)

## 2015-04-30 NOTE — Progress Notes (Signed)
Study: A Phase 2b/3 Double Blind Safety and Efficacy Study of Injectable Cabotegravir compared to Daily Oral Tenofovir Disoproxil Fumarate/Emtricitabine (TDF/FTC), For Pre-Exposure Prophylaxis in HIV-Uninfected Cisgender Men and Transgender Women who have sex with Men.  Medication: Investigational Injectable Cabotegravir/placebo compared to Truvada/placebo. Duration: Around 4 years.  Keith Becker is here for wek 6. IV Rapid is negative. He states he had some pain at the injection site and this lasted a couple days. No other problems or issues. He did not need to medicate for the pain. Pill count performed and he has been adherent. He received $50 gift card and will see hi in 3 weeks for next injection. Tacey HeapElisha Alichia Alridge RN

## 2015-05-20 ENCOUNTER — Encounter (INDEPENDENT_AMBULATORY_CARE_PROVIDER_SITE_OTHER): Payer: Self-pay | Admitting: *Deleted

## 2015-05-20 VITALS — BP 118/80 | HR 52 | Temp 98.0°F | Wt 160.0 lb

## 2015-05-20 DIAGNOSIS — Z006 Encounter for examination for normal comparison and control in clinical research program: Secondary | ICD-10-CM

## 2015-05-20 LAB — COMPREHENSIVE METABOLIC PANEL
ALT: 13 U/L (ref 9–46)
AST: 11 U/L (ref 10–40)
Albumin: 4.3 g/dL (ref 3.6–5.1)
Alkaline Phosphatase: 51 U/L (ref 40–115)
BILIRUBIN TOTAL: 0.5 mg/dL (ref 0.2–1.2)
BUN: 11 mg/dL (ref 7–25)
CHLORIDE: 106 mmol/L (ref 98–110)
CO2: 27 mmol/L (ref 20–31)
CREATININE: 0.74 mg/dL (ref 0.60–1.35)
Calcium: 9.6 mg/dL (ref 8.6–10.3)
Glucose, Bld: 85 mg/dL (ref 65–99)
Potassium: 4.3 mmol/L (ref 3.5–5.3)
SODIUM: 142 mmol/L (ref 135–146)
Total Protein: 6.8 g/dL (ref 6.1–8.1)

## 2015-05-20 LAB — CBC WITH DIFFERENTIAL/PLATELET
BASOS PCT: 1 %
Basophils Absolute: 42 cells/uL (ref 0–200)
EOS ABS: 168 {cells}/uL (ref 15–500)
Eosinophils Relative: 4 %
HCT: 41 % (ref 38.5–50.0)
HEMOGLOBIN: 12.8 g/dL — AB (ref 13.2–17.1)
LYMPHS ABS: 1932 {cells}/uL (ref 850–3900)
LYMPHS PCT: 46 %
MCH: 23.1 pg — AB (ref 27.0–33.0)
MCHC: 31.2 g/dL — AB (ref 32.0–36.0)
MCV: 73.9 fL — ABNORMAL LOW (ref 80.0–100.0)
MONO ABS: 462 {cells}/uL (ref 200–950)
MPV: 10.4 fL (ref 7.5–12.5)
Monocytes Relative: 11 %
NEUTROS ABS: 1596 {cells}/uL (ref 1500–7800)
Neutrophils Relative %: 38 %
Platelets: 186 10*3/uL (ref 140–400)
RBC: 5.55 MIL/uL (ref 4.20–5.80)
RDW: 16.5 % — AB (ref 11.0–15.0)
WBC: 4.2 10*3/uL (ref 3.8–10.8)

## 2015-05-20 LAB — LIPASE: Lipase: 21 U/L (ref 7–60)

## 2015-05-20 LAB — PHOSPHORUS: Phosphorus: 4.1 mg/dL (ref 2.5–4.5)

## 2015-05-20 LAB — HIV ANTIBODY (ROUTINE TESTING W REFLEX): HIV 1&2 Ab, 4th Generation: NONREACTIVE

## 2015-05-20 LAB — AMYLASE: Amylase: 103 U/L (ref 0–105)

## 2015-05-20 LAB — CK: Total CK: 155 U/L (ref 7–232)

## 2015-05-20 NOTE — Progress Notes (Signed)
Study: A Phase 2b/3 Double Blind Safety and Efficacy Study of Injectable Cabotegravir compared to Daily Oral Tenofovir Disoproxil Fumarate/Emtricitabine (TDF/FTC), For Pre-Exposure Prophylaxis in HIV-Uninfected Cisgender Men and Transgender Women who have sex with Men.  Medication: Investigational Injectable Cabotegravir/placebo compared to Truvada/placebo. Duration: Around 4 years.  Keith Becker here for week 9 visit. No new complaints verbalized. Excellent adherence with his oral study medication, TDF/FTC or placebo. Rapid HIV non-reactive. Received his Cabotegravir/placebo injection today LUQ (L) gluteal. He will return next week for his site injection visit.

## 2015-05-24 IMAGING — US US ABDOMEN LIMITED
1 series · 14 of 25 positions shown · non-contrast
Comparison: None.

CLINICAL DATA: Abdominal pain

LIMITED ABDOMINAL ULTRASOUND - RIGHT UPPER QUADRANT

[Series 1: us abdomen limited · 0.22mm/px · 14 of 146 slices shown]
[im 1/146]
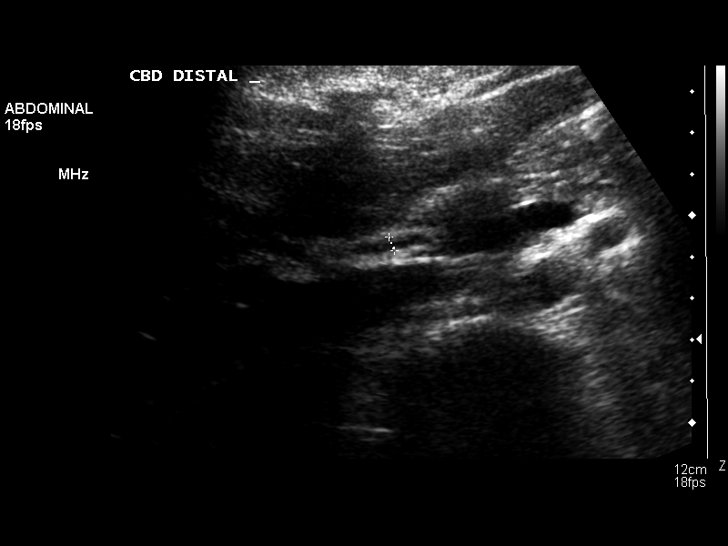
[im 13/146]
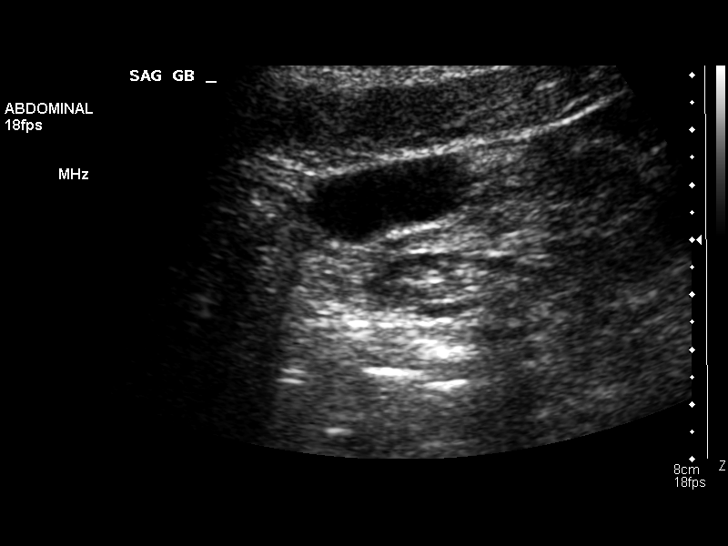
[im 25/146]
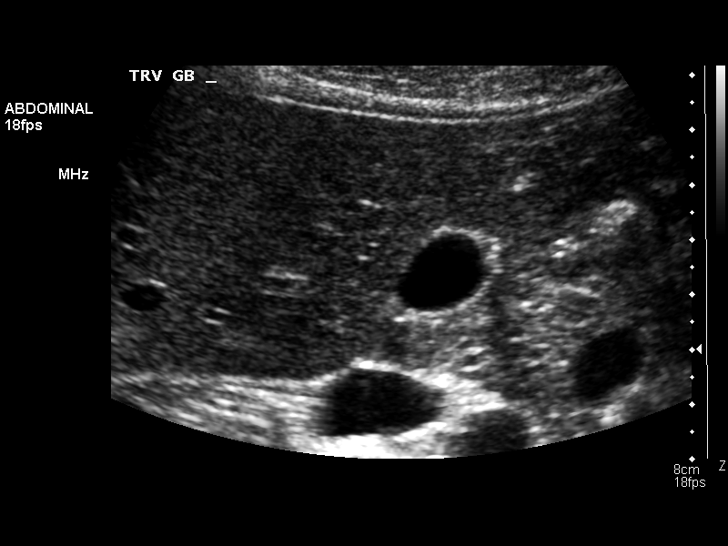
[im 37/146]
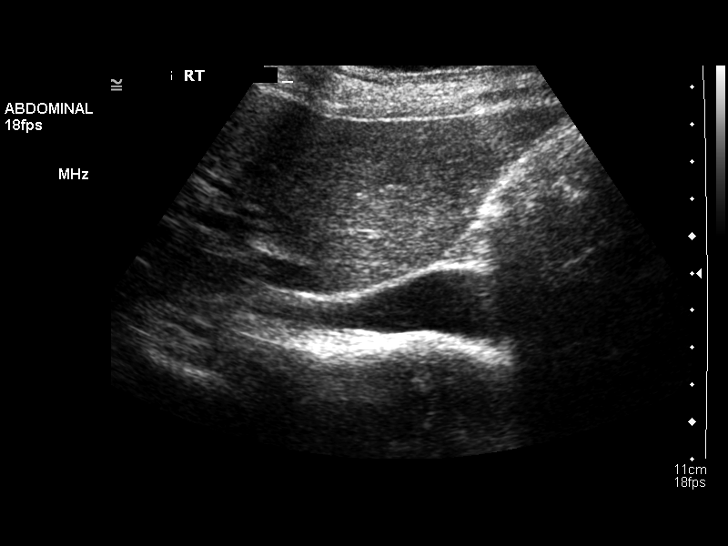
[im 49/146]
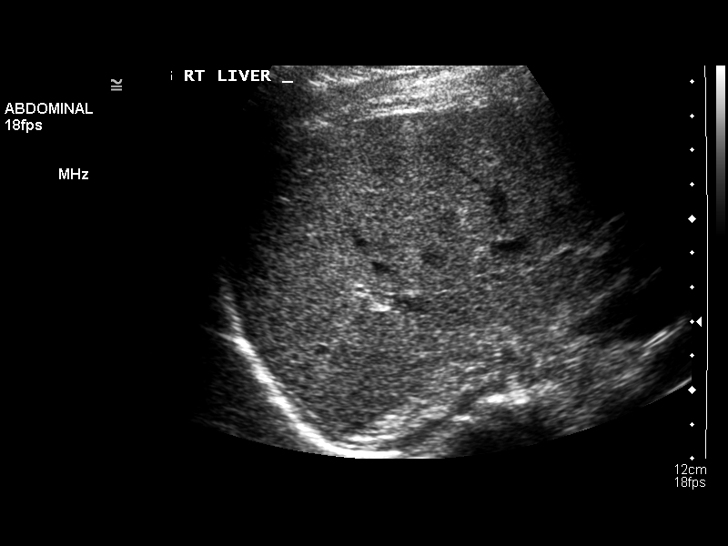
[im 55/146]
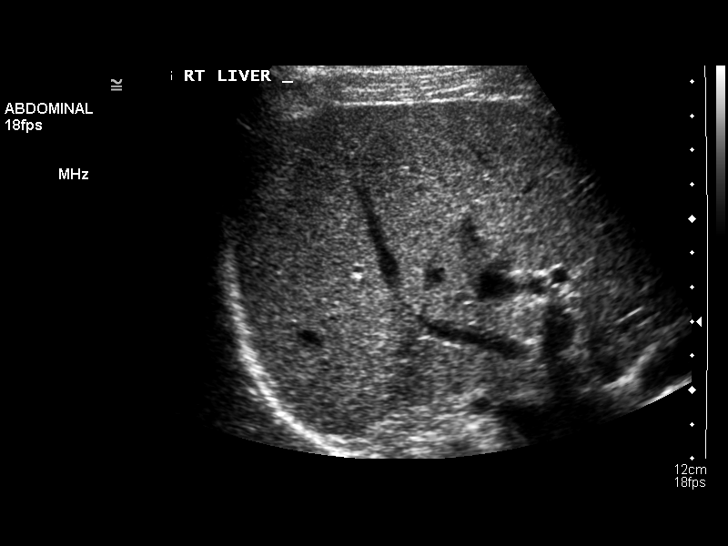
[im 67/146]
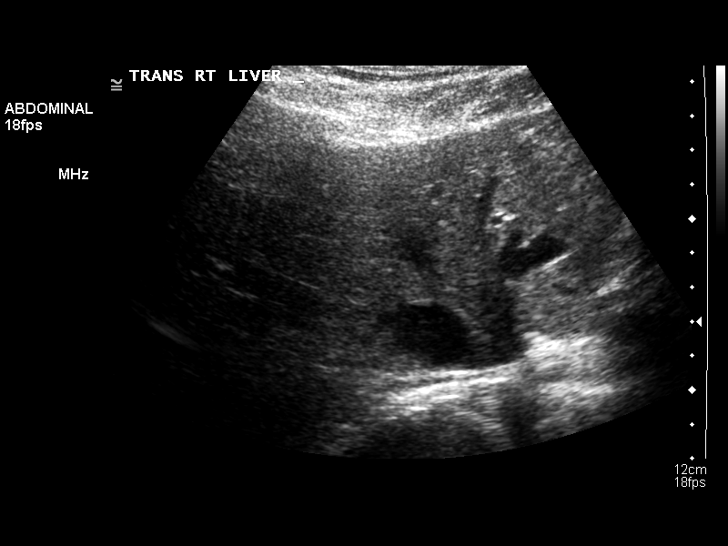
[im 79/146]
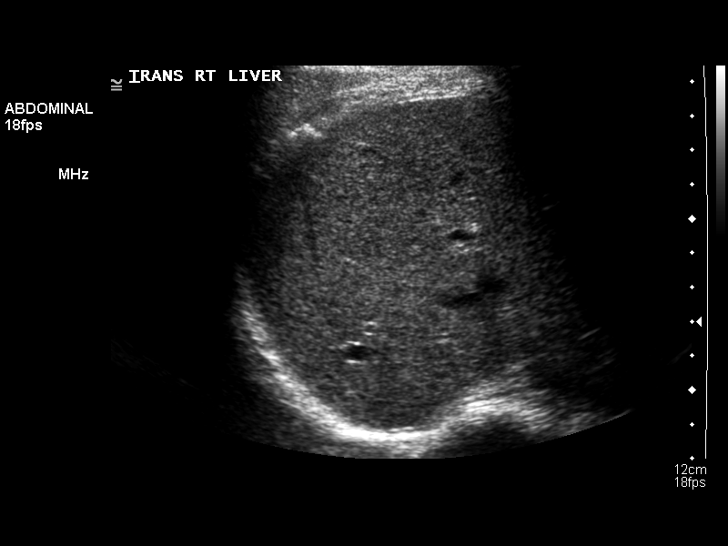
[im 91/146]
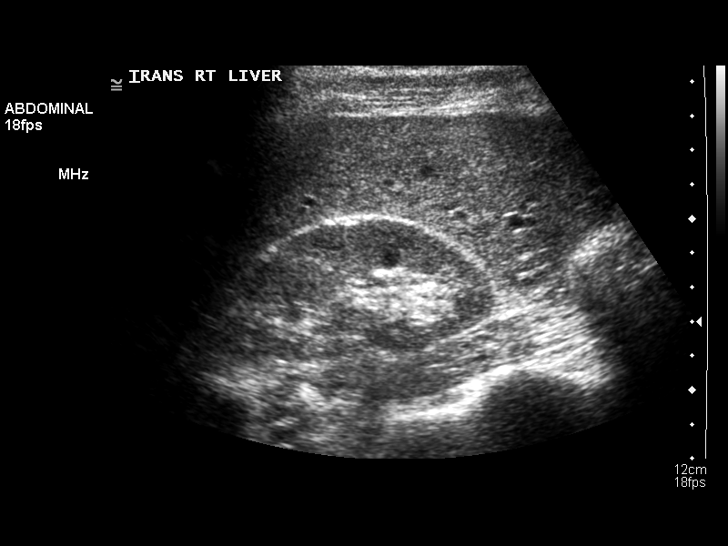
[im 97/146]
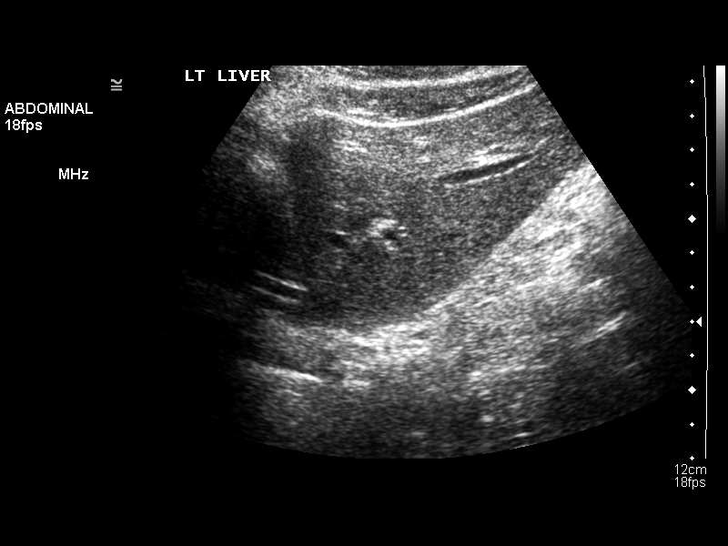
[im 109/146]
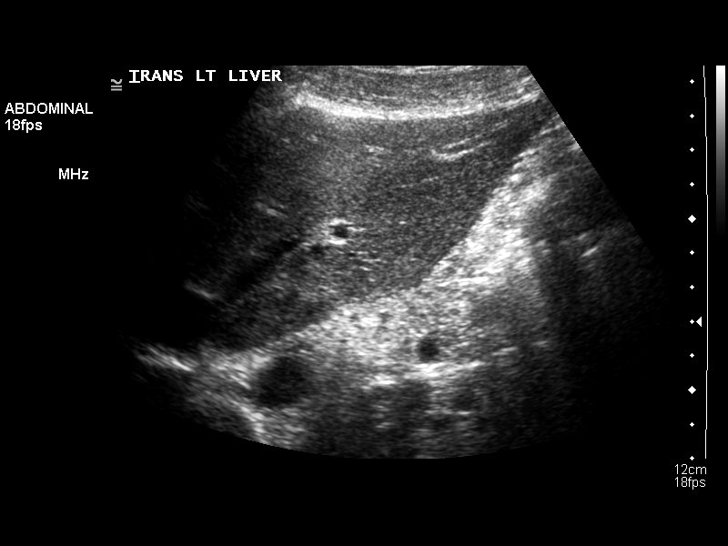
[im 121/146]
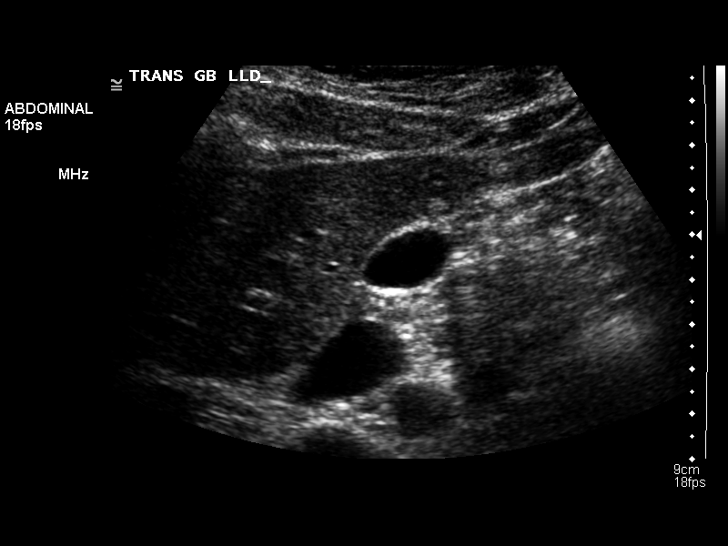
[im 133/146]
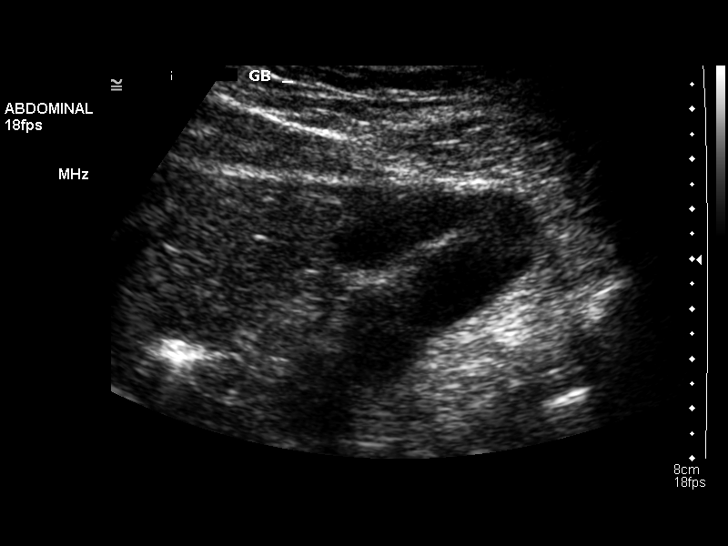
[im 146/146]
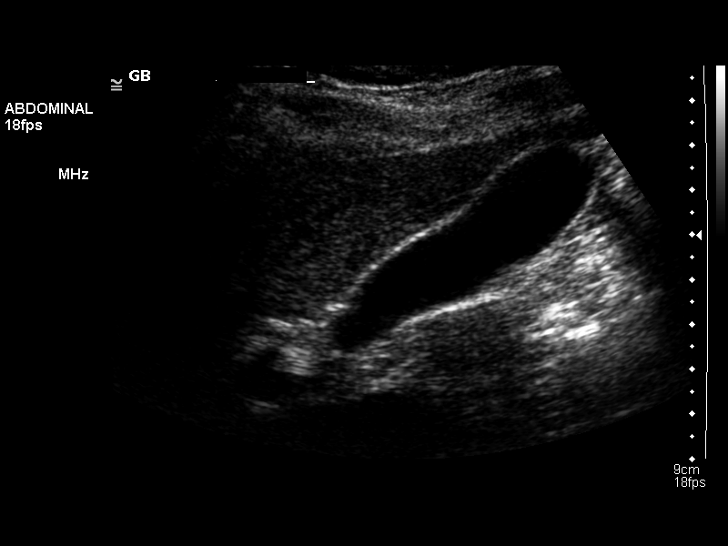

[14 of 25 positions shown; findings below may reference images not displayed]

FINDINGS: Gallbladder:  Moderately distended with no stones or wall
thickening or pericholecystic fluid.

Common bile duct:  Normal in caliber with no stones.  Common bile
duct measures 3.5 mm.

Liver:  Normal in size and echogenicity.  No liver mass or focal
lesion is seen.  Hepatopetal flow is noted in the portal vein.
IMPRESSION: Normal limited right upper quadrant ultrasound.

## 2015-05-28 ENCOUNTER — Encounter (INDEPENDENT_AMBULATORY_CARE_PROVIDER_SITE_OTHER): Payer: Self-pay | Admitting: *Deleted

## 2015-05-28 VITALS — BP 115/68 | HR 65 | Temp 98.1°F | Wt 161.5 lb

## 2015-05-28 DIAGNOSIS — Z006 Encounter for examination for normal comparison and control in clinical research program: Secondary | ICD-10-CM

## 2015-05-28 LAB — CBC WITH DIFFERENTIAL/PLATELET
Basophils Absolute: 42 cells/uL (ref 0–200)
Basophils Relative: 1 %
Eosinophils Absolute: 126 cells/uL (ref 15–500)
Eosinophils Relative: 3 %
HEMATOCRIT: 41.4 % (ref 38.5–50.0)
Hemoglobin: 12.7 g/dL — ABNORMAL LOW (ref 13.2–17.1)
LYMPHS PCT: 35 %
Lymphs Abs: 1470 cells/uL (ref 850–3900)
MCH: 23 pg — ABNORMAL LOW (ref 27.0–33.0)
MCHC: 30.7 g/dL — AB (ref 32.0–36.0)
MCV: 75 fL — AB (ref 80.0–100.0)
MONO ABS: 294 {cells}/uL (ref 200–950)
MONOS PCT: 7 %
MPV: 10.4 fL (ref 7.5–12.5)
NEUTROS PCT: 54 %
Neutro Abs: 2268 cells/uL (ref 1500–7800)
PLATELETS: 202 10*3/uL (ref 140–400)
RBC: 5.52 MIL/uL (ref 4.20–5.80)
RDW: 16.5 % — AB (ref 11.0–15.0)
WBC: 4.2 10*3/uL (ref 3.8–10.8)

## 2015-05-28 NOTE — Progress Notes (Signed)
Study: A Phase 2b/3 Double Blind Safety and Efficacy Study of Injectable Cabotegravir compared to Daily Oral Tenofovir Disoproxil Fumarate/Emtricitabine (TDF/FTC), For Pre-Exposure Prophylaxis in HIV-Uninfected Cisgender Men and Transgender Women who have sex with Men.  Medication: Investigational Injectable Cabotegravir/placebo compared to Truvada/placebo. Duration: Around 4 years.  Cote here for his week 10 visit. Rapid HIV non-reactive. Stated that he had mild tenderness to touch at the injection site, which lasted about a day. No medication for pain required. No other complaints verbalized. He is scheduled to return in June for his next injection visit.

## 2015-05-29 LAB — COMPREHENSIVE METABOLIC PANEL
ALT: 11 U/L (ref 9–46)
AST: 14 U/L (ref 10–40)
Albumin: 4.5 g/dL (ref 3.6–5.1)
Alkaline Phosphatase: 52 U/L (ref 40–115)
BUN: 11 mg/dL (ref 7–25)
CALCIUM: 9.2 mg/dL (ref 8.6–10.3)
CO2: 28 mmol/L (ref 20–31)
Chloride: 106 mmol/L (ref 98–110)
Creat: 0.93 mg/dL (ref 0.60–1.35)
GLUCOSE: 93 mg/dL (ref 65–99)
POTASSIUM: 4.6 mmol/L (ref 3.5–5.3)
Sodium: 142 mmol/L (ref 135–146)
Total Bilirubin: 0.4 mg/dL (ref 0.2–1.2)
Total Protein: 7 g/dL (ref 6.1–8.1)

## 2015-05-29 LAB — LIPASE: LIPASE: 8 U/L (ref 7–60)

## 2015-05-29 LAB — AMYLASE: AMYLASE: 99 U/L (ref 0–105)

## 2015-05-29 LAB — PHOSPHORUS: Phosphorus: 3.9 mg/dL (ref 2.5–4.5)

## 2015-05-29 LAB — HIV ANTIBODY (ROUTINE TESTING W REFLEX): HIV 1&2 Ab, 4th Generation: NONREACTIVE

## 2015-05-29 LAB — CK: Total CK: 136 U/L (ref 7–232)

## 2015-07-17 ENCOUNTER — Encounter (INDEPENDENT_AMBULATORY_CARE_PROVIDER_SITE_OTHER): Payer: BLUE CROSS/BLUE SHIELD

## 2015-07-17 VITALS — BP 129/78 | HR 83 | Temp 98.1°F | Wt 163.8 lb

## 2015-07-17 DIAGNOSIS — Z006 Encounter for examination for normal comparison and control in clinical research program: Secondary | ICD-10-CM

## 2015-07-17 LAB — COMPREHENSIVE METABOLIC PANEL
ALBUMIN: 4.3 g/dL (ref 3.6–5.1)
ALT: 13 U/L (ref 9–46)
AST: 13 U/L (ref 10–40)
Alkaline Phosphatase: 46 U/L (ref 40–115)
BUN: 7 mg/dL (ref 7–25)
CALCIUM: 8.8 mg/dL (ref 8.6–10.3)
CHLORIDE: 105 mmol/L (ref 98–110)
CO2: 24 mmol/L (ref 20–31)
Creat: 0.89 mg/dL (ref 0.60–1.35)
GLUCOSE: 122 mg/dL — AB (ref 65–99)
POTASSIUM: 3.8 mmol/L (ref 3.5–5.3)
Sodium: 140 mmol/L (ref 135–146)
Total Bilirubin: 0.4 mg/dL (ref 0.2–1.2)
Total Protein: 6.6 g/dL (ref 6.1–8.1)

## 2015-07-17 LAB — CBC WITH DIFFERENTIAL/PLATELET
Basophils Absolute: 38 cells/uL (ref 0–200)
Basophils Relative: 1 %
EOS ABS: 152 {cells}/uL (ref 15–500)
Eosinophils Relative: 4 %
HEMATOCRIT: 40.7 % (ref 38.5–50.0)
Hemoglobin: 12.9 g/dL — ABNORMAL LOW (ref 13.2–17.1)
LYMPHS PCT: 42 %
Lymphs Abs: 1596 cells/uL (ref 850–3900)
MCH: 23.2 pg — ABNORMAL LOW (ref 27.0–33.0)
MCHC: 31.7 g/dL — AB (ref 32.0–36.0)
MCV: 73.3 fL — AB (ref 80.0–100.0)
MONO ABS: 266 {cells}/uL (ref 200–950)
MONOS PCT: 7 %
MPV: 10.2 fL (ref 7.5–12.5)
NEUTROS PCT: 46 %
Neutro Abs: 1748 cells/uL (ref 1500–7800)
PLATELETS: 185 10*3/uL (ref 140–400)
RBC: 5.55 MIL/uL (ref 4.20–5.80)
RDW: 16.1 % — AB (ref 11.0–15.0)
WBC: 3.8 10*3/uL (ref 3.8–10.8)

## 2015-07-17 LAB — LIPASE: LIPASE: 9 U/L (ref 7–60)

## 2015-07-17 LAB — AMYLASE: Amylase: 79 U/L (ref 0–105)

## 2015-07-17 LAB — CK: Total CK: 151 U/L (ref 7–232)

## 2015-07-17 LAB — PHOSPHORUS: Phosphorus: 3.4 mg/dL (ref 2.5–4.5)

## 2015-07-17 MED ORDER — ACYCLOVIR 200 MG PO CAPS: 1000.0000 mg | ORAL_CAPSULE | Freq: Two times a day (BID) | ORAL | Status: DC

## 2015-07-18 LAB — HIV ANTIBODY (ROUTINE TESTING W REFLEX): HIV: NONREACTIVE

## 2015-07-23 NOTE — Progress Notes (Unsigned)
Study: A Phase 2b/3 Double Blind Safety and Efficacy Study of Injectable Cabotegravir compared to Daily Oral Tenofovir Disoproxil Fumarate/Emtricitabine (TDF/FTC), For Pre-Exposure Prophylaxis in HIV-Uninfected Cisgender Men and Transgender Women who have sex with Men.  Medication: Investigational Injectable Cabotegravir/placebo compared to Truvada/placebo. Duration: Around 4 years.   Keith Becker is here for Week 17. 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 Received verbal from Dr. Daiva EvesVan Dam to prescribe acyclovir. No other problems noted. Pill count performed and #4 remain. These were returned to pharmacy. He received #90 of study drug and injection into gluteal muscle with no problems. He received $50 gift card. Next appointment scheduled for 6/16/20174 at 10am. Tacey HeapElisha Angi Goodell RN

## 2015-07-26 ENCOUNTER — Encounter (INDEPENDENT_AMBULATORY_CARE_PROVIDER_SITE_OTHER): Payer: Self-pay | Admitting: *Deleted

## 2015-07-26 VITALS — BP 115/71 | HR 56 | Temp 98.0°F | Wt 164.5 lb

## 2015-07-26 DIAGNOSIS — Z006 Encounter for examination for normal comparison and control in clinical research program: Secondary | ICD-10-CM

## 2015-07-26 LAB — COMPREHENSIVE METABOLIC PANEL
ALBUMIN: 4 g/dL (ref 3.6–5.1)
ALK PHOS: 45 U/L (ref 40–115)
ALT: 11 U/L (ref 9–46)
AST: 13 U/L (ref 10–40)
BUN: 10 mg/dL (ref 7–25)
CHLORIDE: 107 mmol/L (ref 98–110)
CO2: 25 mmol/L (ref 20–31)
CREATININE: 0.78 mg/dL (ref 0.60–1.35)
Calcium: 9.1 mg/dL (ref 8.6–10.3)
Glucose, Bld: 68 mg/dL (ref 65–99)
POTASSIUM: 4 mmol/L (ref 3.5–5.3)
SODIUM: 141 mmol/L (ref 135–146)
TOTAL PROTEIN: 6.4 g/dL (ref 6.1–8.1)
Total Bilirubin: 0.3 mg/dL (ref 0.2–1.2)

## 2015-07-26 LAB — CBC WITH DIFFERENTIAL/PLATELET
BASOS ABS: 49 {cells}/uL (ref 0–200)
Basophils Relative: 1 %
EOS ABS: 196 {cells}/uL (ref 15–500)
Eosinophils Relative: 4 %
HCT: 38.3 % — ABNORMAL LOW (ref 38.5–50.0)
HEMOGLOBIN: 12.2 g/dL — AB (ref 13.2–17.1)
LYMPHS ABS: 2597 {cells}/uL (ref 850–3900)
Lymphocytes Relative: 53 %
MCH: 22.9 pg — AB (ref 27.0–33.0)
MCHC: 31.9 g/dL — ABNORMAL LOW (ref 32.0–36.0)
MCV: 72 fL — AB (ref 80.0–100.0)
MPV: 10.3 fL (ref 7.5–12.5)
Monocytes Absolute: 441 cells/uL (ref 200–950)
Monocytes Relative: 9 %
NEUTROS ABS: 1617 {cells}/uL (ref 1500–7800)
Neutrophils Relative %: 33 %
Platelets: 191 10*3/uL (ref 140–400)
RBC: 5.32 MIL/uL (ref 4.20–5.80)
RDW: 15.7 % — ABNORMAL HIGH (ref 11.0–15.0)
WBC: 4.9 10*3/uL (ref 3.8–10.8)

## 2015-07-26 LAB — PHOSPHORUS: Phosphorus: 3.9 mg/dL (ref 2.5–4.5)

## 2015-07-26 LAB — CK: CK TOTAL: 178 U/L (ref 7–232)

## 2015-07-26 LAB — LIPASE: Lipase: 89 U/L — ABNORMAL HIGH (ref 7–60)

## 2015-07-26 LAB — HIV ANTIBODY (ROUTINE TESTING W REFLEX): HIV 1&2 Ab, 4th Generation: NONREACTIVE

## 2015-07-26 LAB — AMYLASE: AMYLASE: 123 U/L — AB (ref 0–105)

## 2015-07-26 NOTE — Progress Notes (Signed)
Study: A Phase 2b/3 Double Blind Safety and Efficacy Study of Injectable Cabotegravir compared to Daily Oral Tenofovir Disoproxil Fumarate/Emtricitabine (TDF/FTC), For Pre-Exposure Prophylaxis in HIV-Uninfected Cisgender Men and Transgender Women who have sex with Men.  Medication: Investigational Injectable Cabotegravir/placebo compared to Truvada/placebo. Duration: Around 4 years.  Keith Becker is here for week 19. No new complaints or concerns verbalized. Denied any tenderness, redness, bruising or swelling at site after last injection. Excellent adherence will oral study medication. Next visit is scheduled for 8/2 @ 11:00am.

## 2015-08-02 ENCOUNTER — Telehealth: Payer: Self-pay

## 2015-08-02 NOTE — Telephone Encounter (Signed)
?   help study participants become as adherent as possible (100% adherence is unlikely/unrealistic) and to provide a space for them to be honest with you about their true adherence ? helping people understand the relationships between their thoughts, feelings, and behaviors and not just giving advice or telling people what to do. Instead try to be: o Nonjudgmental o Collaborative o Normalize participant difficulties This study will be conducted in three phases, or "steps."  . Step I: all participants will receive two oral study products. . Step II: participants will receive one oral product and one injectable product. . Step III: participants will either be offered 48 weeks of open label PrEP Our conversation will primarily focus on: 1. Getting to study appointments, obtaining refills of oral product, and receiving injections on time. 2. Communicating with study staff about questions and concerns. 3. Coping with side effects. 4. Formulating a daily medication schedule and reminders for oral study product. 5. Storing oral study product. 6. Handling missed doses of oral study product and missed injections. 7. Harm reduction/education  

## 2015-09-11 ENCOUNTER — Telehealth: Payer: Self-pay

## 2015-09-11 ENCOUNTER — Encounter (INDEPENDENT_AMBULATORY_CARE_PROVIDER_SITE_OTHER): Payer: Self-pay | Admitting: *Deleted

## 2015-09-11 VITALS — BP 101/65 | HR 56 | Temp 97.8°F | Wt 158.2 lb

## 2015-09-11 DIAGNOSIS — Z006 Encounter for examination for normal comparison and control in clinical research program: Secondary | ICD-10-CM

## 2015-09-11 LAB — COMPREHENSIVE METABOLIC PANEL
ALK PHOS: 47 U/L (ref 40–115)
ALT: 12 U/L (ref 9–46)
AST: 14 U/L (ref 10–40)
Albumin: 4.1 g/dL (ref 3.6–5.1)
BILIRUBIN TOTAL: 0.5 mg/dL (ref 0.2–1.2)
BUN: 10 mg/dL (ref 7–25)
CALCIUM: 9.1 mg/dL (ref 8.6–10.3)
CO2: 26 mmol/L (ref 20–31)
Chloride: 106 mmol/L (ref 98–110)
Creat: 0.99 mg/dL (ref 0.60–1.35)
GLUCOSE: 89 mg/dL (ref 65–99)
Potassium: 4.5 mmol/L (ref 3.5–5.3)
Sodium: 140 mmol/L (ref 135–146)
Total Protein: 6.7 g/dL (ref 6.1–8.1)

## 2015-09-11 LAB — CBC WITH DIFFERENTIAL/PLATELET
BASOS ABS: 43 {cells}/uL (ref 0–200)
Basophils Relative: 1 %
EOS PCT: 3 %
Eosinophils Absolute: 129 cells/uL (ref 15–500)
HCT: 41.9 % (ref 38.5–50.0)
Hemoglobin: 13.1 g/dL — ABNORMAL LOW (ref 13.2–17.1)
Lymphocytes Relative: 45 %
Lymphs Abs: 1935 cells/uL (ref 850–3900)
MCH: 23.1 pg — ABNORMAL LOW (ref 27.0–33.0)
MCHC: 31.3 g/dL — AB (ref 32.0–36.0)
MCV: 73.8 fL — ABNORMAL LOW (ref 80.0–100.0)
MONOS PCT: 10 %
MPV: 10.2 fL (ref 7.5–12.5)
Monocytes Absolute: 430 cells/uL (ref 200–950)
NEUTROS ABS: 1763 {cells}/uL (ref 1500–7800)
NEUTROS PCT: 41 %
PLATELETS: 190 10*3/uL (ref 140–400)
RBC: 5.68 MIL/uL (ref 4.20–5.80)
RDW: 16.8 % — AB (ref 11.0–15.0)
WBC: 4.3 10*3/uL (ref 3.8–10.8)

## 2015-09-11 LAB — CK: Total CK: 138 U/L (ref 7–232)

## 2015-09-11 LAB — LIPASE: LIPASE: 13 U/L (ref 7–60)

## 2015-09-11 LAB — AMYLASE: Amylase: 86 U/L (ref 0–105)

## 2015-09-11 LAB — PHOSPHORUS: Phosphorus: 3.5 mg/dL (ref 2.5–4.5)

## 2015-09-11 NOTE — Progress Notes (Signed)
Study: A Phase 2b/3 Double Blind Safety and Efficacy Study of Injectable Cabotegravir compared to Daily Oral Tenofovir Disoproxil Fumarate/Emtricitabine (TDF/FTC), For Pre-Exposure Prophylaxis in HIV-Uninfected Cisgender Men and Transgender Women who have sex with Men.  Medication: Investigational Injectable Cabotegravir/placebo compared to Truvada/placebo. Duration: Around 4 years.  Ike here for week 25 visit. No new complaints or concerns verbalized. States that he may have missed 1-2 doses of his oral (TDF/FTC/placebo) study medication. He did meet with Correll today. Rapid HIV non-reactive. Received his Cabotegravir/placebo injection today. Next visit scheduled for 8/16 @ 11:00am.

## 2015-09-12 LAB — HIV ANTIBODY (ROUTINE TESTING W REFLEX): HIV: NONREACTIVE

## 2015-09-25 ENCOUNTER — Encounter (INDEPENDENT_AMBULATORY_CARE_PROVIDER_SITE_OTHER): Payer: Self-pay | Admitting: *Deleted

## 2015-09-25 VITALS — BP 113/69 | HR 52 | Temp 97.9°F | Wt 161.8 lb

## 2015-09-25 DIAGNOSIS — Z006 Encounter for examination for normal comparison and control in clinical research program: Secondary | ICD-10-CM

## 2015-09-25 LAB — COMPREHENSIVE METABOLIC PANEL
ALBUMIN: 4.1 g/dL (ref 3.6–5.1)
ALK PHOS: 43 U/L (ref 40–115)
ALT: 12 U/L (ref 9–46)
AST: 12 U/L (ref 10–40)
BILIRUBIN TOTAL: 0.5 mg/dL (ref 0.2–1.2)
BUN: 10 mg/dL (ref 7–25)
CALCIUM: 9.2 mg/dL (ref 8.6–10.3)
CO2: 24 mmol/L (ref 20–31)
Chloride: 108 mmol/L (ref 98–110)
Creat: 0.87 mg/dL (ref 0.60–1.35)
GLUCOSE: 84 mg/dL (ref 65–99)
POTASSIUM: 4 mmol/L (ref 3.5–5.3)
SODIUM: 141 mmol/L (ref 135–146)
Total Protein: 6.5 g/dL (ref 6.1–8.1)

## 2015-09-25 LAB — CBC WITH DIFFERENTIAL/PLATELET
BASOS ABS: 44 {cells}/uL (ref 0–200)
Basophils Relative: 1 %
EOS PCT: 5 %
Eosinophils Absolute: 220 cells/uL (ref 15–500)
HCT: 37.6 % — ABNORMAL LOW (ref 38.5–50.0)
Hemoglobin: 12.1 g/dL — ABNORMAL LOW (ref 13.2–17.1)
LYMPHS ABS: 2288 {cells}/uL (ref 850–3900)
Lymphocytes Relative: 52 %
MCH: 23.4 pg — AB (ref 27.0–33.0)
MCHC: 32.2 g/dL (ref 32.0–36.0)
MCV: 72.7 fL — ABNORMAL LOW (ref 80.0–100.0)
MONOS PCT: 9 %
MPV: 10.3 fL (ref 7.5–12.5)
Monocytes Absolute: 396 cells/uL (ref 200–950)
NEUTROS ABS: 1452 {cells}/uL — AB (ref 1500–7800)
NEUTROS PCT: 33 %
PLATELETS: 177 10*3/uL (ref 140–400)
RBC: 5.17 MIL/uL (ref 4.20–5.80)
RDW: 16.9 % — ABNORMAL HIGH (ref 11.0–15.0)
WBC: 4.4 10*3/uL (ref 3.8–10.8)

## 2015-09-25 LAB — LIPASE: Lipase: <5 U/L — ABNORMAL LOW (ref 7–60)

## 2015-09-25 LAB — PHOSPHORUS: PHOSPHORUS: 4 mg/dL (ref 2.5–4.5)

## 2015-09-25 LAB — AMYLASE: Amylase: 73 U/L (ref 0–105)

## 2015-09-25 LAB — CK: CK TOTAL: 124 U/L (ref 7–232)

## 2015-09-25 NOTE — Progress Notes (Signed)
Study: A Phase 2b/3 Double Blind Safety and Efficacy Study of Injectable Cabotegravir compared to Daily Oral Tenofovir Disoproxil Fumarate/Emtricitabine (TDF/FTC), For Pre-Exposure Prophylaxis in HIV-Uninfected Cisgender Men and Transgender Women who have sex with Men.  Medication: Investigational Injectable Cabotegravir/placebo compared to Truvada/placebo. Duration: Around 4 years.   Keith Becker is here for his week 27 visit. Verbalized mild site tenderness post injection which only lasted a day. Denied any redness, bruising or swelling. No other complaints or concerns verbalized. No missed doses of his oral study medication. Rapid HIV non-reactive. Next visit scheduled for 9/27 @ 11:00am.

## 2015-09-26 LAB — HIV ANTIBODY (ROUTINE TESTING W REFLEX): HIV: NONREACTIVE

## 2015-10-02 ENCOUNTER — Telehealth: Payer: Self-pay

## 2015-11-20 ENCOUNTER — Encounter: Payer: BLUE CROSS/BLUE SHIELD | Admitting: *Deleted

## 2015-11-20 VITALS — BP 119/74 | HR 54 | Temp 98.7°F | Wt 159.8 lb

## 2015-11-20 DIAGNOSIS — Z006 Encounter for examination for normal comparison and control in clinical research program: Secondary | ICD-10-CM

## 2015-11-20 LAB — CBC WITH DIFFERENTIAL/PLATELET
BASOS PCT: 1 %
Basophils Absolute: 42 cells/uL (ref 0–200)
EOS ABS: 126 {cells}/uL (ref 15–500)
Eosinophils Relative: 3 %
HEMATOCRIT: 38.6 % (ref 38.5–50.0)
HEMOGLOBIN: 12.4 g/dL — AB (ref 13.2–17.1)
LYMPHS ABS: 1806 {cells}/uL (ref 850–3900)
Lymphocytes Relative: 43 %
MCH: 23.5 pg — ABNORMAL LOW (ref 27.0–33.0)
MCHC: 32.1 g/dL (ref 32.0–36.0)
MCV: 73.1 fL — ABNORMAL LOW (ref 80.0–100.0)
MONO ABS: 378 {cells}/uL (ref 200–950)
MPV: 10.1 fL (ref 7.5–12.5)
Monocytes Relative: 9 %
NEUTROS PCT: 44 %
Neutro Abs: 1848 cells/uL (ref 1500–7800)
Platelets: 193 10*3/uL (ref 140–400)
RBC: 5.28 MIL/uL (ref 4.20–5.80)
RDW: 16.1 % — ABNORMAL HIGH (ref 11.0–15.0)
WBC: 4.2 10*3/uL (ref 3.8–10.8)

## 2015-11-20 LAB — COMPREHENSIVE METABOLIC PANEL
ALBUMIN: 4.3 g/dL (ref 3.6–5.1)
ALK PHOS: 46 U/L (ref 40–115)
ALT: 14 U/L (ref 9–46)
AST: 16 U/L (ref 10–40)
BILIRUBIN TOTAL: 0.6 mg/dL (ref 0.2–1.2)
BUN: 12 mg/dL (ref 7–25)
CALCIUM: 9.4 mg/dL (ref 8.6–10.3)
CO2: 27 mmol/L (ref 20–31)
Chloride: 106 mmol/L (ref 98–110)
Creat: 0.82 mg/dL (ref 0.60–1.35)
GLUCOSE: 86 mg/dL (ref 65–99)
POTASSIUM: 4 mmol/L (ref 3.5–5.3)
Sodium: 140 mmol/L (ref 135–146)
TOTAL PROTEIN: 6.8 g/dL (ref 6.1–8.1)

## 2015-11-20 LAB — PHOSPHORUS: Phosphorus: 3.3 mg/dL (ref 2.5–4.5)

## 2015-11-20 LAB — AMYLASE: AMYLASE: 102 U/L (ref 0–105)

## 2015-11-20 LAB — LIPASE: LIPASE: 18 U/L (ref 7–60)

## 2015-11-20 LAB — CK: Total CK: 194 U/L (ref 7–232)

## 2015-11-20 NOTE — Progress Notes (Signed)
Study: A Phase 2b/3 Double Blind Safety and Efficacy Study of Injectable Cabotegravir compared to Daily Oral Tenofovir Disoproxil Fumarate/Emtricitabine (TDF/FTC), For Pre-Exposure Prophylaxis in HIV-Uninfected Cisgender Men and Transgender Women who have sex with Men.  Medication: Investigational Injectable Cabotegravir/placebo compared to Truvada/placebo. Duration: Around 4 years.  Keith Becker is here for week 33. He denies any new symptoms, medications, or issues. HIV rapid was negative. #17 pills returned. He received injection in right gluteal muscle with no problems. Condoms given and $50 gift card given for visit. Next appointment next Wednesday @ 1pm. Tacey HeapElisha Guage Efferson RN

## 2015-11-21 LAB — HIV ANTIBODY (ROUTINE TESTING W REFLEX): HIV 1&2 Ab, 4th Generation: NONREACTIVE

## 2015-11-21 LAB — GC/CHLAMYDIA PROBE AMP
CT PROBE, AMP APTIMA: NOT DETECTED
GC Probe RNA: NOT DETECTED

## 2015-11-21 LAB — RPR

## 2015-11-22 LAB — CT/NG RNA, TMA RECTAL
CHLAMYDIA TRACHOMATIS RNA: NOT DETECTED
NEISSERIA GONORRHOEAE RNA: NOT DETECTED

## 2015-11-25 ENCOUNTER — Other Ambulatory Visit: Payer: BLUE CROSS/BLUE SHIELD

## 2015-11-25 ENCOUNTER — Ambulatory Visit: Payer: BLUE CROSS/BLUE SHIELD

## 2015-11-27 ENCOUNTER — Encounter (INDEPENDENT_AMBULATORY_CARE_PROVIDER_SITE_OTHER): Payer: Self-pay

## 2015-11-27 ENCOUNTER — Encounter (INDEPENDENT_AMBULATORY_CARE_PROVIDER_SITE_OTHER): Payer: Self-pay | Admitting: *Deleted

## 2015-11-27 VITALS — BP 121/70 | HR 60 | Temp 98.0°F | Wt 163.5 lb

## 2015-11-27 DIAGNOSIS — Z006 Encounter for examination for normal comparison and control in clinical research program: Secondary | ICD-10-CM

## 2015-11-27 LAB — CBC WITH DIFFERENTIAL/PLATELET
BASOS PCT: 0 %
Basophils Absolute: 0 cells/uL (ref 0–200)
EOS ABS: 180 {cells}/uL (ref 15–500)
Eosinophils Relative: 4 %
HEMATOCRIT: 38.8 % (ref 38.5–50.0)
HEMOGLOBIN: 11.8 g/dL — AB (ref 13.2–17.1)
LYMPHS ABS: 1665 {cells}/uL (ref 850–3900)
LYMPHS PCT: 37 %
MCH: 23.1 pg — ABNORMAL LOW (ref 27.0–33.0)
MCHC: 30.4 g/dL — ABNORMAL LOW (ref 32.0–36.0)
MCV: 75.9 fL — AB (ref 80.0–100.0)
MONO ABS: 495 {cells}/uL (ref 200–950)
MPV: 9.5 fL (ref 7.5–12.5)
Monocytes Relative: 11 %
Neutro Abs: 2160 cells/uL (ref 1500–7800)
Neutrophils Relative %: 48 %
PLATELETS: 190 10*3/uL (ref 140–400)
RBC: 5.11 MIL/uL (ref 4.20–5.80)
RDW: 16.1 % — AB (ref 11.0–15.0)
WBC: 4.5 10*3/uL (ref 3.8–10.8)

## 2015-11-27 LAB — COMPREHENSIVE METABOLIC PANEL
ALBUMIN: 3.8 g/dL (ref 3.6–5.1)
ALT: 11 U/L (ref 9–46)
AST: 13 U/L (ref 10–40)
Alkaline Phosphatase: 44 U/L (ref 40–115)
BUN: 13 mg/dL (ref 7–25)
CHLORIDE: 112 mmol/L — AB (ref 98–110)
CO2: 28 mmol/L (ref 20–31)
CREATININE: 0.78 mg/dL (ref 0.60–1.35)
Calcium: 8.8 mg/dL (ref 8.6–10.3)
Glucose, Bld: 66 mg/dL (ref 65–99)
POTASSIUM: 4.4 mmol/L (ref 3.5–5.3)
SODIUM: 145 mmol/L (ref 135–146)
Total Bilirubin: 0.4 mg/dL (ref 0.2–1.2)
Total Protein: 6.3 g/dL (ref 6.1–8.1)

## 2015-11-27 LAB — PHOSPHORUS: Phosphorus: 2.8 mg/dL (ref 2.5–4.5)

## 2015-11-27 LAB — HIV ANTIBODY (ROUTINE TESTING W REFLEX): HIV: NONREACTIVE

## 2015-11-27 LAB — CK: Total CK: 145 U/L (ref 7–232)

## 2015-11-27 LAB — AMYLASE: AMYLASE: 83 U/L (ref 0–105)

## 2015-11-27 LAB — LIPASE: Lipase: 8 U/L (ref 7–60)

## 2015-11-27 NOTE — Progress Notes (Signed)
Study: A Phase 2b/3 Double Blind Safety and Efficacy Study of Injectable Cabotegravir compared to Daily Oral Tenofovir Disoproxil Fumarate/Emtricitabine (TDF/FTC), For Pre-Exposure Prophylaxis in HIV-Uninfected Cisgender Men and Transgender Women who have sex with Men.  Medication: Investigational Injectable Cabotegravir/placebo compared to Truvada/placebo. Duration: Around 4 years.  Keith Becker is here for week 35 visit. Verbalized mild site tenderness post injection. No other complaints verbalized. Stated that he had been contacted by the [facility] because he was identified as possible contact/exposure to syphilis. Was tested and treated on 10/13. [facility] Next study visit scheduled for 11/21 @ 9;00am.

## 2015-12-19 ENCOUNTER — Encounter: Payer: Self-pay | Admitting: Podiatry

## 2015-12-19 ENCOUNTER — Ambulatory Visit (INDEPENDENT_AMBULATORY_CARE_PROVIDER_SITE_OTHER): Payer: BLUE CROSS/BLUE SHIELD | Admitting: Podiatry

## 2015-12-19 ENCOUNTER — Other Ambulatory Visit: Payer: Self-pay | Admitting: *Deleted

## 2015-12-19 ENCOUNTER — Ambulatory Visit (INDEPENDENT_AMBULATORY_CARE_PROVIDER_SITE_OTHER): Payer: BLUE CROSS/BLUE SHIELD

## 2015-12-19 VITALS — BP 116/71 | HR 55 | Resp 16

## 2015-12-19 DIAGNOSIS — Q828 Other specified congenital malformations of skin: Secondary | ICD-10-CM

## 2015-12-19 DIAGNOSIS — M7751 Other enthesopathy of right foot: Secondary | ICD-10-CM

## 2015-12-19 DIAGNOSIS — M778 Other enthesopathies, not elsewhere classified: Secondary | ICD-10-CM

## 2015-12-19 DIAGNOSIS — M2041 Other hammer toe(s) (acquired), right foot: Secondary | ICD-10-CM | POA: Diagnosis not present

## 2015-12-19 DIAGNOSIS — M779 Enthesopathy, unspecified: Principal | ICD-10-CM

## 2015-12-19 NOTE — Progress Notes (Signed)
   Subjective:    Patient ID: Keith Becker, male    DOB: 01-22-1986, 30 y.o.   MRN: [id number]  HPI: He presents today with chief complaint of pain to the plantar forefoot right. 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 He states there is a callused area and is getting pretty thick he states there have to do a lot of walking for the job and is starting to really become painful some days more so than others he also states that would have a corn, fifth toe.    Review of Systems  Skin: Positive for rash.  All other systems reviewed and are negative.      Objective:   Physical Exam: Low signs are stable he is alert and oriented 3. Pulses are palpable. Neurologic sensory is intact. Degenerative flexor intact. Muscle strength is 5 over 5 dorsiflexion plantar flexors and inverters everters all to the musculature is intact. Orthopedic evaluation demonstrates no tenderness on dorsiflexion or plantar flexion of the second metatarsophalangeal joint of the right foot onto his distal ankle full range of motion without crepitation and without pain. Radiographs 3 views of bilateral foot taken today in the office demonstrated osseously mature individual elongated second metatarsals. No osseous fractures are noted. Cutaneous evaluation demonstrates a well-hydrated cutis reactive hyperkeratosis overlying the PIPJ fifth digit right foot as well as subsecond metatarsophalangeal joint of the right foot. Once debrided without bleeding does demonstrate what appears to be a porokeratotic lesion is associated with pressure from elongated plantarflexed second metatarsal.        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 Corn fifth digit right foot.  Plan: Discussed appropriate shoe gear stretching to size ice therapy shoe modifications and particularly wider shoes and deeper toe boxes. I also  debrided the reactive hyperkeratosis for him today and discussed the possible need for orthotics.

## 2015-12-30 ENCOUNTER — Encounter: Payer: BLUE CROSS/BLUE SHIELD | Admitting: *Deleted

## 2015-12-30 VITALS — BP 107/70 | HR 57 | Temp 98.7°F | Wt 159.0 lb

## 2015-12-30 DIAGNOSIS — Z006 Encounter for examination for normal comparison and control in clinical research program: Secondary | ICD-10-CM

## 2015-12-30 LAB — CBC WITH DIFFERENTIAL/PLATELET
BASOS ABS: 0 {cells}/uL (ref 0–200)
Basophils Relative: 0 %
EOS ABS: 162 {cells}/uL (ref 15–500)
Eosinophils Relative: 3 %
HEMATOCRIT: 42.7 % (ref 38.5–50.0)
HEMOGLOBIN: 13.3 g/dL (ref 13.2–17.1)
LYMPHS ABS: 2430 {cells}/uL (ref 850–3900)
Lymphocytes Relative: 45 %
MCH: 23.6 pg — AB (ref 27.0–33.0)
MCHC: 31.1 g/dL — AB (ref 32.0–36.0)
MCV: 75.8 fL — ABNORMAL LOW (ref 80.0–100.0)
MONO ABS: 540 {cells}/uL (ref 200–950)
MPV: 9.6 fL (ref 7.5–12.5)
Monocytes Relative: 10 %
NEUTROS PCT: 42 %
Neutro Abs: 2268 cells/uL (ref 1500–7800)
Platelets: 197 10*3/uL (ref 140–400)
RBC: 5.63 MIL/uL (ref 4.20–5.80)
RDW: 16.3 % — ABNORMAL HIGH (ref 11.0–15.0)
WBC: 5.4 10*3/uL (ref 3.8–10.8)

## 2015-12-30 LAB — COMPREHENSIVE METABOLIC PANEL
ALK PHOS: 48 U/L (ref 40–115)
ALT: 15 U/L (ref 9–46)
AST: 16 U/L (ref 10–40)
Albumin: 4.4 g/dL (ref 3.6–5.1)
BUN: 11 mg/dL (ref 7–25)
CALCIUM: 9.1 mg/dL (ref 8.6–10.3)
CO2: 26 mmol/L (ref 20–31)
Chloride: 107 mmol/L (ref 98–110)
Creat: 0.86 mg/dL (ref 0.60–1.35)
GLUCOSE: 92 mg/dL (ref 65–99)
POTASSIUM: 4.1 mmol/L (ref 3.5–5.3)
Sodium: 142 mmol/L (ref 135–146)
TOTAL PROTEIN: 7 g/dL (ref 6.1–8.1)
Total Bilirubin: 0.4 mg/dL (ref 0.2–1.2)

## 2015-12-30 LAB — HIV ANTIBODY (ROUTINE TESTING W REFLEX): HIV 1&2 Ab, 4th Generation: NONREACTIVE

## 2015-12-30 LAB — AMYLASE: Amylase: 83 U/L (ref 0–105)

## 2015-12-30 LAB — LIPASE: Lipase: 10 U/L (ref 7–60)

## 2015-12-30 LAB — PHOSPHORUS: Phosphorus: 3.2 mg/dL (ref 2.5–4.5)

## 2015-12-30 LAB — CK: CK TOTAL: 156 U/L (ref 7–232)

## 2015-12-30 NOTE — Progress Notes (Signed)
Keith Becker is here for his week 41 visit for Study: A Phase 2b/3 Double Blind Safety and Efficacy Study of Injectable Cabotegravir compared to Daily Oral Tenofovir Disoproxil Fumarate/Emtricitabine (TDF/FTC), For Pre-Exposure Prophylaxis in HIV-Uninfected Cisgender Men and Transgender Women who have sex with Men.  Medication: Investigational Injectable Cabotegravir/placebo compared to Truvada/placebo. Duration: Around 4 years.  He denies any new problems or concerns. He admits that his adherence is about 70% and we discussed adherence strategies. He last missed a dose over the weekend. He says he had 5 different partners over the past 4 weeks with about 30 sexual encounters, using condoms about a half of the time. He does not feel like he had an exposure to HIV and doesn't feel sick. He received the study medication injection in his left buttock without problem. He is to return December 1 st for a safety check.

## 2016-01-10 ENCOUNTER — Encounter (INDEPENDENT_AMBULATORY_CARE_PROVIDER_SITE_OTHER): Payer: Self-pay | Admitting: *Deleted

## 2016-01-10 VITALS — BP 110/71 | HR 52 | Temp 98.4°F | Wt 156.2 lb

## 2016-01-10 DIAGNOSIS — Z006 Encounter for examination for normal comparison and control in clinical research program: Secondary | ICD-10-CM

## 2016-01-10 LAB — CBC WITH DIFFERENTIAL/PLATELET
BASOS ABS: 58 {cells}/uL (ref 0–200)
Basophils Relative: 1 %
EOS ABS: 348 {cells}/uL (ref 15–500)
EOS PCT: 6 %
HCT: 43.5 % (ref 38.5–50.0)
HEMOGLOBIN: 13.3 g/dL (ref 13.2–17.1)
LYMPHS ABS: 2030 {cells}/uL (ref 850–3900)
Lymphocytes Relative: 35 %
MCH: 23.3 pg — AB (ref 27.0–33.0)
MCHC: 30.6 g/dL — AB (ref 32.0–36.0)
MCV: 76.3 fL — AB (ref 80.0–100.0)
MONOS PCT: 10 %
MPV: 10 fL (ref 7.5–12.5)
Monocytes Absolute: 580 cells/uL (ref 200–950)
NEUTROS PCT: 48 %
Neutro Abs: 2784 cells/uL (ref 1500–7800)
Platelets: 208 10*3/uL (ref 140–400)
RBC: 5.7 MIL/uL (ref 4.20–5.80)
RDW: 16.3 % — ABNORMAL HIGH (ref 11.0–15.0)
WBC: 5.8 10*3/uL (ref 3.8–10.8)

## 2016-01-10 NOTE — Progress Notes (Unsigned)
Study: A Phase 2b/3 Double Blind Safety and Efficacy Study of Injectable Cabotegravir compared to Daily Oral Tenofovir Disoproxil Fumarate/Emtricitabine (TDF/FTC), For Pre-Exposure Prophylaxis in HIV-Uninfected Cisgender Men and Transgender Women who have sex with Men.  Medication: Investigational Injectable Cabotegravir/placebo compared to Truvada/placebo. Duration: Around 4 years.  Quy is here for week 43 visit. Verbalized mild site tenderness and nodule post injection which lasted 4 days. States nodule was about 0.5cm x 0.5cm. No redness or bruising noted at site. States that he has been dealing with cold symptoms (cough, runny nose and nasal congestion) for the past several days. Has been using OTC medications to treat symptoms. Noted expiratory wheezing on (R) side to ascultation. No complaints of dyspnea or SOB. Instructed him to follow-up with PCP if symptoms do not resolve or if they become worse. Agrees and states understanding. Next visit scheduled for 02/24/16 @ 9:30am.

## 2016-01-11 LAB — COMPREHENSIVE METABOLIC PANEL
ALK PHOS: 60 U/L (ref 40–115)
ALT: 19 U/L (ref 9–46)
AST: 19 U/L (ref 10–40)
Albumin: 4.2 g/dL (ref 3.6–5.1)
BILIRUBIN TOTAL: 0.4 mg/dL (ref 0.2–1.2)
BUN: 9 mg/dL (ref 7–25)
CO2: 24 mmol/L (ref 20–31)
Calcium: 9.5 mg/dL (ref 8.6–10.3)
Chloride: 107 mmol/L (ref 98–110)
Creat: 0.91 mg/dL (ref 0.60–1.35)
GLUCOSE: 119 mg/dL — AB (ref 65–99)
Potassium: 4.4 mmol/L (ref 3.5–5.3)
SODIUM: 143 mmol/L (ref 135–146)
Total Protein: 6.7 g/dL (ref 6.1–8.1)

## 2016-01-11 LAB — CK: Total CK: 327 U/L — ABNORMAL HIGH (ref 7–232)

## 2016-01-11 LAB — HIV ANTIBODY (ROUTINE TESTING W REFLEX): HIV: NONREACTIVE

## 2016-01-11 LAB — AMYLASE: AMYLASE: 106 U/L — AB (ref 0–105)

## 2016-01-11 LAB — LIPASE: Lipase: 12 U/L (ref 7–60)

## 2016-01-11 LAB — PHOSPHORUS: Phosphorus: 2.9 mg/dL (ref 2.5–4.5)

## 2016-02-24 ENCOUNTER — Encounter (INDEPENDENT_AMBULATORY_CARE_PROVIDER_SITE_OTHER): Payer: BLUE CROSS/BLUE SHIELD | Admitting: *Deleted

## 2016-02-24 VITALS — BP 112/67 | HR 60 | Temp 98.4°F | Wt 162.0 lb

## 2016-02-24 DIAGNOSIS — Z006 Encounter for examination for normal comparison and control in clinical research program: Secondary | ICD-10-CM

## 2016-02-24 LAB — CBC WITH DIFFERENTIAL/PLATELET
BASOS ABS: 48 {cells}/uL (ref 0–200)
BASOS PCT: 1 %
EOS ABS: 288 {cells}/uL (ref 15–500)
EOS PCT: 6 %
HCT: 40.2 % (ref 38.5–50.0)
HEMOGLOBIN: 12.4 g/dL — AB (ref 13.2–17.1)
LYMPHS ABS: 2064 {cells}/uL (ref 850–3900)
Lymphocytes Relative: 43 %
MCH: 23 pg — AB (ref 27.0–33.0)
MCHC: 30.8 g/dL — ABNORMAL LOW (ref 32.0–36.0)
MCV: 74.7 fL — AB (ref 80.0–100.0)
MONOS PCT: 9 %
MPV: 10 fL (ref 7.5–12.5)
Monocytes Absolute: 432 cells/uL (ref 200–950)
Neutro Abs: 1968 cells/uL (ref 1500–7800)
Neutrophils Relative %: 41 %
PLATELETS: 203 10*3/uL (ref 140–400)
RBC: 5.38 MIL/uL (ref 4.20–5.80)
RDW: 16.2 % — ABNORMAL HIGH (ref 11.0–15.0)
WBC: 4.8 10*3/uL (ref 3.8–10.8)

## 2016-02-24 LAB — COMPREHENSIVE METABOLIC PANEL
ALBUMIN: 4 g/dL (ref 3.6–5.1)
ALK PHOS: 46 U/L (ref 40–115)
ALT: 15 U/L (ref 9–46)
AST: 15 U/L (ref 10–40)
BILIRUBIN TOTAL: 0.4 mg/dL (ref 0.2–1.2)
BUN: 13 mg/dL (ref 7–25)
CALCIUM: 8.7 mg/dL (ref 8.6–10.3)
CO2: 29 mmol/L (ref 20–31)
CREATININE: 0.88 mg/dL (ref 0.60–1.35)
Chloride: 106 mmol/L (ref 98–110)
GLUCOSE: 116 mg/dL — AB (ref 65–99)
Potassium: 4.5 mmol/L (ref 3.5–5.3)
SODIUM: 138 mmol/L (ref 135–146)
Total Protein: 6.6 g/dL (ref 6.1–8.1)

## 2016-02-24 LAB — HIV ANTIBODY (ROUTINE TESTING W REFLEX): HIV: NONREACTIVE

## 2016-02-24 LAB — PHOSPHORUS: PHOSPHORUS: 3.5 mg/dL (ref 2.5–4.5)

## 2016-02-24 NOTE — Progress Notes (Signed)
Keith Becker is here for his week 49 visit for Study: A Phase 2b/3 Double Blind Safety and Efficacy Study of Injectable Cabotegravir compared to Daily Oral Tenofovir Disoproxil Fumarate/Emtricitabine (TDF/FTC), For Pre-Exposure Prophylaxis in HIV-Uninfected Cisgender Men and Transgender Women who have sex with Men.  Medication: Investigational Injectable Cabotegravir/placebo compared to Truvada/placebo. Duration: Around 4 years.  This is an injection visit and he brought his pill bottles back. He had 46 pills returned and admits to missing some doses but not sure how many. He said he had taken some out of an old bottle told him to start with the new bottles we will give him today. He has a mild sore throat that started yesterday and a day of loose stools last week he thinks was from something he ate. The injection was given without problem in his rt buttock. He is to return in 2 weeks for followup.

## 2016-02-25 LAB — AMYLASE: Amylase: 124 U/L — ABNORMAL HIGH (ref 0–105)

## 2016-02-25 LAB — LIPASE: LIPASE: 33 U/L (ref 7–60)

## 2016-02-25 LAB — CK: Total CK: 125 U/L (ref 7–232)

## 2016-03-12 ENCOUNTER — Encounter (INDEPENDENT_AMBULATORY_CARE_PROVIDER_SITE_OTHER): Payer: BLUE CROSS/BLUE SHIELD | Admitting: *Deleted

## 2016-03-12 VITALS — BP 125/82 | HR 67 | Temp 98.5°F | Wt 160.2 lb

## 2016-03-12 DIAGNOSIS — Z006 Encounter for examination for normal comparison and control in clinical research program: Secondary | ICD-10-CM

## 2016-03-12 LAB — CBC WITH DIFFERENTIAL/PLATELET
BASOS PCT: 1 %
Basophils Absolute: 54 cells/uL (ref 0–200)
Eosinophils Absolute: 216 cells/uL (ref 15–500)
Eosinophils Relative: 4 %
HCT: 41 % (ref 38.5–50.0)
Hemoglobin: 12.5 g/dL — ABNORMAL LOW (ref 13.2–17.1)
LYMPHS PCT: 40 %
Lymphs Abs: 2160 cells/uL (ref 850–3900)
MCH: 22.8 pg — ABNORMAL LOW (ref 27.0–33.0)
MCHC: 30.5 g/dL — AB (ref 32.0–36.0)
MCV: 74.8 fL — ABNORMAL LOW (ref 80.0–100.0)
MONOS PCT: 10 %
MPV: 10.3 fL (ref 7.5–12.5)
Monocytes Absolute: 540 cells/uL (ref 200–950)
Neutro Abs: 2430 cells/uL (ref 1500–7800)
Neutrophils Relative %: 45 %
PLATELETS: 154 10*3/uL (ref 140–400)
RBC: 5.48 MIL/uL (ref 4.20–5.80)
RDW: 16.1 % — AB (ref 11.0–15.0)
WBC: 5.4 10*3/uL (ref 3.8–10.8)

## 2016-03-12 NOTE — Progress Notes (Signed)
Keith Becker was seen today for week 51 of Study: A Phase 2b/3 Double Blind Safety and Efficacy Study of Injectable Cabotegravir compared to Daily Oral Tenofovir Disoproxil Fumarate/Emtricitabine (TDF/FTC), For Pre-Exposure Prophylaxis in HIV-Uninfected Cisgender Men and Transgender Women who have sex with Men.  Medication: Investigational Injectable Cabotegravir/placebo compared to Truvada/placebo. Duration: Around 4 years.  He denies any new problems or concerns about his injection 2 weeks ago. His sore throat has resolved. He will return March 14th for the next study visit.

## 2016-03-13 LAB — COMPREHENSIVE METABOLIC PANEL
ALK PHOS: 45 U/L (ref 40–115)
ALT: 15 U/L (ref 9–46)
AST: 14 U/L (ref 10–40)
Albumin: 4.2 g/dL (ref 3.6–5.1)
BILIRUBIN TOTAL: 0.4 mg/dL (ref 0.2–1.2)
BUN: 8 mg/dL (ref 7–25)
CO2: 24 mmol/L (ref 20–31)
Calcium: 9.1 mg/dL (ref 8.6–10.3)
Chloride: 107 mmol/L (ref 98–110)
Creat: 0.83 mg/dL (ref 0.60–1.35)
GLUCOSE: 70 mg/dL (ref 65–99)
Potassium: 4 mmol/L (ref 3.5–5.3)
Sodium: 142 mmol/L (ref 135–146)
Total Protein: 6.7 g/dL (ref 6.1–8.1)

## 2016-03-13 LAB — PHOSPHORUS: Phosphorus: 3.9 mg/dL (ref 2.5–4.5)

## 2016-03-13 LAB — HIV ANTIBODY (ROUTINE TESTING W REFLEX): HIV: NONREACTIVE

## 2016-03-13 LAB — LIPASE: Lipase: 6 U/L — ABNORMAL LOW (ref 7–60)

## 2016-03-13 LAB — AMYLASE: Amylase: 91 U/L (ref 0–105)

## 2016-03-13 LAB — CK: CK TOTAL: 113 U/L (ref 7–232)

## 2016-04-22 ENCOUNTER — Encounter (INDEPENDENT_AMBULATORY_CARE_PROVIDER_SITE_OTHER): Payer: BLUE CROSS/BLUE SHIELD | Admitting: *Deleted

## 2016-04-22 VITALS — BP 117/77 | HR 56 | Temp 98.5°F | Wt 156.5 lb

## 2016-04-22 DIAGNOSIS — Z006 Encounter for examination for normal comparison and control in clinical research program: Secondary | ICD-10-CM

## 2016-04-22 LAB — COMPREHENSIVE METABOLIC PANEL
ALK PHOS: 48 U/L (ref 40–115)
ALT: 15 U/L (ref 9–46)
AST: 15 U/L (ref 10–40)
Albumin: 4.4 g/dL (ref 3.6–5.1)
BILIRUBIN TOTAL: 0.5 mg/dL (ref 0.2–1.2)
BUN: 12 mg/dL (ref 7–25)
CALCIUM: 9.2 mg/dL (ref 8.6–10.3)
CO2: 28 mmol/L (ref 20–31)
Chloride: 104 mmol/L (ref 98–110)
Creat: 0.8 mg/dL (ref 0.60–1.35)
Glucose, Bld: 92 mg/dL (ref 65–99)
POTASSIUM: 4.1 mmol/L (ref 3.5–5.3)
Sodium: 140 mmol/L (ref 135–146)
TOTAL PROTEIN: 7 g/dL (ref 6.1–8.1)

## 2016-04-22 LAB — CBC WITH DIFFERENTIAL/PLATELET
BASOS PCT: 0 %
Basophils Absolute: 0 cells/uL (ref 0–200)
EOS ABS: 45 {cells}/uL (ref 15–500)
Eosinophils Relative: 1 %
HEMATOCRIT: 41.2 % (ref 38.5–50.0)
Hemoglobin: 13 g/dL — ABNORMAL LOW (ref 13.2–17.1)
Lymphocytes Relative: 40 %
Lymphs Abs: 1800 cells/uL (ref 850–3900)
MCH: 23.2 pg — ABNORMAL LOW (ref 27.0–33.0)
MCHC: 31.6 g/dL — ABNORMAL LOW (ref 32.0–36.0)
MCV: 73.4 fL — AB (ref 80.0–100.0)
MONO ABS: 360 {cells}/uL (ref 200–950)
MPV: 9.9 fL (ref 7.5–12.5)
Monocytes Relative: 8 %
NEUTROS ABS: 2295 {cells}/uL (ref 1500–7800)
Neutrophils Relative %: 51 %
PLATELETS: 192 10*3/uL (ref 140–400)
RBC: 5.61 MIL/uL (ref 4.20–5.80)
RDW: 16.3 % — ABNORMAL HIGH (ref 11.0–15.0)
WBC: 4.5 10*3/uL (ref 3.8–10.8)

## 2016-04-22 LAB — CK: Total CK: 246 U/L — ABNORMAL HIGH (ref 7–232)

## 2016-04-22 LAB — LIPID PANEL
CHOL/HDL RATIO: 2.5 ratio (ref <5.0)
CHOLESTEROL: 128 mg/dL (ref <200)
HDL: 51 mg/dL (ref >40)
LDL CALC: 70 mg/dL (ref <100)
TRIGLYCERIDES: 33 mg/dL (ref <150)
VLDL: 7 mg/dL (ref <30)

## 2016-04-22 LAB — LIPASE: Lipase: 19 U/L (ref 7–60)

## 2016-04-22 LAB — HIV ANTIBODY (ROUTINE TESTING W REFLEX): HIV 1&2 Ab, 4th Generation: NONREACTIVE

## 2016-04-22 LAB — PHOSPHORUS: Phosphorus: 3.9 mg/dL (ref 2.5–4.5)

## 2016-04-22 LAB — AMYLASE: AMYLASE: 108 U/L — AB (ref 0–105)

## 2016-04-22 NOTE — Progress Notes (Signed)
Study: A Phase 2b/3 Double Blind Safety and Efficacy Study of Injectable Cabotegravir compared to Daily Oral Tenofovir Disoproxil Fumarate/Emtricitabine (TDF/FTC), For Pre-Exposure Prophylaxis in HIV-Uninfected Cisgender Men and Transgender Women who have sex with Men.  Medication: Investigational Injectable Cabotegravir/placebo compared to Truvada/placebo. Duration: Around 4 years.  He[place] is here for week 57. Fasting labs were drawn. HIV rapid confirmed non-reactive. He denies any new issues. EKG performed and assessed. Questionnaires completed. Returned #42 pills of oral study product. Injection given in left gluteal muscle with no problems. Study meds dispensed. He received $50 gift card for visit as well as condoms/lube. Next appointment scheduled for 3/22.

## 2016-04-23 LAB — URINALYSIS
BILIRUBIN URINE: NEGATIVE
GLUCOSE, UA: NEGATIVE
Hgb urine dipstick: NEGATIVE
KETONES UR: NEGATIVE
Leukocytes, UA: NEGATIVE
Nitrite: NEGATIVE
PH: 6 (ref 5.0–8.0)
Protein, ur: NEGATIVE
SPECIFIC GRAVITY, URINE: 1.026 (ref 1.001–1.035)

## 2016-04-23 LAB — HEPATITIS C ANTIBODY: HCV Ab: NEGATIVE

## 2016-04-23 LAB — RPR

## 2016-04-24 LAB — GC/CHLAMYDIA PROBE AMP
CT Probe RNA: NOT DETECTED
GC Probe RNA: NOT DETECTED

## 2016-04-25 LAB — CT/NG RNA, TMA RECTAL
Chlamydia Trachomatis RNA: NOT DETECTED
Neisseria Gonorrhoeae RNA: DETECTED — AB

## 2016-05-01 ENCOUNTER — Encounter (INDEPENDENT_AMBULATORY_CARE_PROVIDER_SITE_OTHER): Payer: BLUE CROSS/BLUE SHIELD | Admitting: *Deleted

## 2016-05-01 VITALS — BP 114/74 | HR 79 | Temp 98.4°F | Wt 160.0 lb

## 2016-05-01 DIAGNOSIS — Z006 Encounter for examination for normal comparison and control in clinical research program: Secondary | ICD-10-CM

## 2016-05-01 LAB — PHOSPHORUS: Phosphorus: 3.1 mg/dL (ref 2.5–4.5)

## 2016-05-01 LAB — CBC WITH DIFFERENTIAL/PLATELET
Basophils Absolute: 43 cells/uL (ref 0–200)
Basophils Relative: 1 %
Eosinophils Absolute: 129 cells/uL (ref 15–500)
Eosinophils Relative: 3 %
HEMATOCRIT: 39.9 % (ref 38.5–50.0)
HEMOGLOBIN: 12.8 g/dL — AB (ref 13.2–17.1)
LYMPHS ABS: 1849 {cells}/uL (ref 850–3900)
Lymphocytes Relative: 43 %
MCH: 23.7 pg — ABNORMAL LOW (ref 27.0–33.0)
MCHC: 32.1 g/dL (ref 32.0–36.0)
MCV: 73.9 fL — ABNORMAL LOW (ref 80.0–100.0)
MONO ABS: 516 {cells}/uL (ref 200–950)
MPV: 9.9 fL (ref 7.5–12.5)
Monocytes Relative: 12 %
NEUTROS PCT: 41 %
Neutro Abs: 1763 cells/uL (ref 1500–7800)
Platelets: 185 10*3/uL (ref 140–400)
RBC: 5.4 MIL/uL (ref 4.20–5.80)
RDW: 16.4 % — ABNORMAL HIGH (ref 11.0–15.0)
WBC: 4.3 10*3/uL (ref 3.8–10.8)

## 2016-05-01 LAB — COMPREHENSIVE METABOLIC PANEL
ALBUMIN: 4.2 g/dL (ref 3.6–5.1)
ALK PHOS: 45 U/L (ref 40–115)
ALT: 11 U/L (ref 9–46)
AST: 14 U/L (ref 10–40)
BILIRUBIN TOTAL: 0.5 mg/dL (ref 0.2–1.2)
BUN: 10 mg/dL (ref 7–25)
CO2: 30 mmol/L (ref 20–31)
CREATININE: 0.89 mg/dL (ref 0.60–1.35)
Calcium: 9 mg/dL (ref 8.6–10.3)
Chloride: 107 mmol/L (ref 98–110)
Glucose, Bld: 97 mg/dL (ref 65–99)
Potassium: 3.8 mmol/L (ref 3.5–5.3)
SODIUM: 142 mmol/L (ref 135–146)
TOTAL PROTEIN: 6.6 g/dL (ref 6.1–8.1)

## 2016-05-01 LAB — HIV ANTIBODY (ROUTINE TESTING W REFLEX): HIV: NONREACTIVE

## 2016-05-01 LAB — AMYLASE: Amylase: 106 U/L — ABNORMAL HIGH (ref 0–105)

## 2016-05-01 LAB — CK: Total CK: 138 U/L (ref 7–232)

## 2016-05-01 LAB — LIPASE: LIPASE: 18 U/L (ref 7–60)

## 2016-05-01 NOTE — Progress Notes (Signed)
Study: A Phase 2b/3 Double Blind Safety and Efficacy Study of Injectable Cabotegravir compared to Daily Oral Tenofovir Disoproxil Fumarate/Emtricitabine (TDF/FTC), For Pre-Exposure Prophylaxis in HIV-Uninfected Cisgender Men and Transgender Women who have sex with Men.  Medication: Investigational Injectable Cabotegravir/placebo compared to Truvada/placebo. Duration: Around 4 years.  Keith Becker is here for safety visit. [facility] for Gonorrhea. No other issues. Blood was drawn and rapid test confirmed to be negative. Questionnaire completed. He received $50 gift card for visit. Next appointment scheduled for 06/10/2016 @ 1130.

## 2016-06-22 ENCOUNTER — Encounter (INDEPENDENT_AMBULATORY_CARE_PROVIDER_SITE_OTHER): Payer: Self-pay | Admitting: *Deleted

## 2016-06-22 VITALS — BP 113/75 | HR 48 | Temp 98.0°F | Wt 156.5 lb

## 2016-06-22 DIAGNOSIS — Z006 Encounter for examination for normal comparison and control in clinical research program: Secondary | ICD-10-CM

## 2016-06-22 LAB — CBC WITH DIFFERENTIAL/PLATELET
Basophils Absolute: 41 cells/uL (ref 0–200)
Basophils Relative: 1 %
EOS ABS: 82 {cells}/uL (ref 15–500)
Eosinophils Relative: 2 %
HCT: 41.7 % (ref 38.5–50.0)
Hemoglobin: 12.8 g/dL — ABNORMAL LOW (ref 13.2–17.1)
LYMPHS PCT: 53 %
Lymphs Abs: 2173 cells/uL (ref 850–3900)
MCH: 23.3 pg — ABNORMAL LOW (ref 27.0–33.0)
MCHC: 30.7 g/dL — AB (ref 32.0–36.0)
MCV: 75.8 fL — AB (ref 80.0–100.0)
MPV: 9.6 fL (ref 7.5–12.5)
Monocytes Absolute: 451 cells/uL (ref 200–950)
Monocytes Relative: 11 %
NEUTROS PCT: 33 %
Neutro Abs: 1353 cells/uL — ABNORMAL LOW (ref 1500–7800)
Platelets: 168 10*3/uL (ref 140–400)
RBC: 5.5 MIL/uL (ref 4.20–5.80)
RDW: 16.4 % — AB (ref 11.0–15.0)
WBC: 4.1 10*3/uL (ref 3.8–10.8)

## 2016-06-22 NOTE — Progress Notes (Signed)
Study: A Phase 2b/3 Double Blind Safety and Efficacy Study of Injectable Cabotegravir compared to Daily Oral Tenofovir Disoproxil Fumarate/Emtricitabine (TDF/FTC), For Pre-Exposure Prophylaxis in HIV-Uninfected Cisgender Men and Transgender Women who have sex with Men.  Medication: Investigational Injectable Cabotegravir/placebo compared to Truvada/placebo. Duration: Around 4 years.   Keith Becker is here for week 65 visit. No new complaints or concern verbalized. States that he has missed a few doses of his oral study medication since his last visit. We did discuss adherence strategies. States that he had 9-10 sexual contacts over past 4 weeks. Denies any acute HIV s/sx's. Rapid HIV non-reactive. Cabotegravir/placebo injection given (R) gluteal muscle. Site unremarkable. Next study visit scheduled for 5/29 @ 2:00pm.

## 2016-06-23 LAB — COMPREHENSIVE METABOLIC PANEL
ALK PHOS: 43 U/L (ref 40–115)
ALT: 12 U/L (ref 9–46)
AST: 14 U/L (ref 10–40)
Albumin: 4.3 g/dL (ref 3.6–5.1)
BUN: 13 mg/dL (ref 7–25)
CALCIUM: 9.2 mg/dL (ref 8.6–10.3)
CHLORIDE: 105 mmol/L (ref 98–110)
CO2: 25 mmol/L (ref 20–31)
Creat: 0.96 mg/dL (ref 0.60–1.35)
GLUCOSE: 76 mg/dL (ref 65–99)
POTASSIUM: 4.6 mmol/L (ref 3.5–5.3)
Sodium: 140 mmol/L (ref 135–146)
Total Bilirubin: 0.6 mg/dL (ref 0.2–1.2)
Total Protein: 6.8 g/dL (ref 6.1–8.1)

## 2016-06-23 LAB — LIPASE: Lipase: 9 U/L (ref 7–60)

## 2016-06-23 LAB — HIV ANTIBODY (ROUTINE TESTING W REFLEX): HIV 1&2 Ab, 4th Generation: NONREACTIVE

## 2016-06-23 LAB — PHOSPHORUS: PHOSPHORUS: 3.3 mg/dL (ref 2.5–4.5)

## 2016-06-23 LAB — AMYLASE: Amylase: 90 U/L (ref 21–101)

## 2016-06-23 LAB — CK: CK TOTAL: 142 U/L (ref 44–196)

## 2016-07-07 ENCOUNTER — Encounter (INDEPENDENT_AMBULATORY_CARE_PROVIDER_SITE_OTHER): Payer: BLUE CROSS/BLUE SHIELD | Admitting: *Deleted

## 2016-07-07 VITALS — BP 129/77 | HR 77 | Temp 98.7°F | Wt 156.5 lb

## 2016-07-07 DIAGNOSIS — Z006 Encounter for examination for normal comparison and control in clinical research program: Secondary | ICD-10-CM

## 2016-07-08 LAB — PHOSPHORUS: PHOSPHORUS: 3.5 mg/dL (ref 2.5–4.5)

## 2016-07-08 LAB — CBC WITH DIFFERENTIAL/PLATELET
BASOS ABS: 44 {cells}/uL (ref 0–200)
Basophils Relative: 1 %
EOS ABS: 88 {cells}/uL (ref 15–500)
Eosinophils Relative: 2 %
HEMATOCRIT: 41.1 % (ref 38.5–50.0)
HEMOGLOBIN: 12.7 g/dL — AB (ref 13.2–17.1)
LYMPHS ABS: 1848 {cells}/uL (ref 850–3900)
LYMPHS PCT: 42 %
MCH: 23.5 pg — AB (ref 27.0–33.0)
MCHC: 30.9 g/dL — AB (ref 32.0–36.0)
MCV: 76.1 fL — AB (ref 80.0–100.0)
MONO ABS: 396 {cells}/uL (ref 200–950)
MPV: 9.9 fL (ref 7.5–12.5)
Monocytes Relative: 9 %
NEUTROS PCT: 46 %
Neutro Abs: 2024 cells/uL (ref 1500–7800)
Platelets: 184 10*3/uL (ref 140–400)
RBC: 5.4 MIL/uL (ref 4.20–5.80)
RDW: 16.2 % — AB (ref 11.0–15.0)
WBC: 4.4 10*3/uL (ref 3.8–10.8)

## 2016-07-08 LAB — COMPREHENSIVE METABOLIC PANEL
ALBUMIN: 4.1 g/dL (ref 3.6–5.1)
ALT: 11 U/L (ref 9–46)
AST: 11 U/L (ref 10–40)
Alkaline Phosphatase: 46 U/L (ref 40–115)
BUN: 7 mg/dL (ref 7–25)
CALCIUM: 9.1 mg/dL (ref 8.6–10.3)
CHLORIDE: 106 mmol/L (ref 98–110)
CO2: 28 mmol/L (ref 20–31)
CREATININE: 0.91 mg/dL (ref 0.60–1.35)
Glucose, Bld: 85 mg/dL (ref 65–99)
POTASSIUM: 4 mmol/L (ref 3.5–5.3)
Sodium: 143 mmol/L (ref 135–146)
TOTAL PROTEIN: 6.3 g/dL (ref 6.1–8.1)
Total Bilirubin: 0.4 mg/dL (ref 0.2–1.2)

## 2016-07-08 LAB — AMYLASE: Amylase: 97 U/L (ref 21–101)

## 2016-07-08 LAB — HIV ANTIBODY (ROUTINE TESTING W REFLEX): HIV 1&2 Ab, 4th Generation: NONREACTIVE

## 2016-07-08 LAB — CK: Total CK: 106 U/L (ref 44–196)

## 2016-07-08 LAB — LIPASE: LIPASE: 7 U/L (ref 7–60)

## 2016-07-08 NOTE — Progress Notes (Signed)
Study: A Phase 2b/3 Double Blind Safety and Efficacy Study of Injectable Cabotegravir compared to Daily Oral Tenofovir Disoproxil Fumarate/Emtricitabine (TDF/FTC), For Pre-Exposure Prophylaxis in HIV-Uninfected Cisgender Men and Transgender Women who have sex with Men.  Medication: Investigational Injectable Cabotegravir/placebo compared to Truvada/placebo. Duration: Around 4 years.  Shion is here for week 67. Keith Becker denies any injection site reaction. Questionnaire completed. Keith Becker returned #259 pills of oral study product TDF/FTC or placebo. Blood drawn and HIV rapid confirmed non-reactive. Keith Becker received $50 gift card. Next appointment scheduled for 7/2

## 2016-08-10 ENCOUNTER — Encounter (INDEPENDENT_AMBULATORY_CARE_PROVIDER_SITE_OTHER): Payer: BLUE CROSS/BLUE SHIELD | Admitting: *Deleted

## 2016-08-10 VITALS — BP 113/61 | HR 63 | Temp 98.7°F | Wt 152.5 lb

## 2016-08-10 DIAGNOSIS — Z006 Encounter for examination for normal comparison and control in clinical research program: Secondary | ICD-10-CM

## 2016-08-10 LAB — LIPASE: LIPASE: 20 U/L (ref 7–60)

## 2016-08-10 LAB — COMPREHENSIVE METABOLIC PANEL
ALBUMIN: 4.5 g/dL (ref 3.6–5.1)
ALK PHOS: 49 U/L (ref 40–115)
ALT: 15 U/L (ref 9–46)
AST: 13 U/L (ref 10–40)
BILIRUBIN TOTAL: 0.7 mg/dL (ref 0.2–1.2)
BUN: 11 mg/dL (ref 7–25)
CALCIUM: 9.1 mg/dL (ref 8.6–10.3)
CO2: 25 mmol/L (ref 20–31)
Chloride: 106 mmol/L (ref 98–110)
Creat: 0.91 mg/dL (ref 0.60–1.35)
Glucose, Bld: 86 mg/dL (ref 65–99)
Potassium: 4 mmol/L (ref 3.5–5.3)
Sodium: 139 mmol/L (ref 135–146)
Total Protein: 6.9 g/dL (ref 6.1–8.1)

## 2016-08-10 LAB — CBC WITH DIFFERENTIAL/PLATELET
BASOS PCT: 0 %
Basophils Absolute: 0 cells/uL (ref 0–200)
EOS ABS: 136 {cells}/uL (ref 15–500)
Eosinophils Relative: 2 %
HEMATOCRIT: 41.1 % (ref 38.5–50.0)
HEMOGLOBIN: 13.2 g/dL (ref 13.2–17.1)
Lymphocytes Relative: 20 %
Lymphs Abs: 1360 cells/uL (ref 850–3900)
MCH: 23.6 pg — ABNORMAL LOW (ref 27.0–33.0)
MCHC: 32.1 g/dL (ref 32.0–36.0)
MCV: 73.5 fL — AB (ref 80.0–100.0)
MONO ABS: 544 {cells}/uL (ref 200–950)
MPV: 9.5 fL (ref 7.5–12.5)
Monocytes Relative: 8 %
NEUTROS ABS: 4760 {cells}/uL (ref 1500–7800)
Neutrophils Relative %: 70 %
Platelets: 173 10*3/uL (ref 140–400)
RBC: 5.59 MIL/uL (ref 4.20–5.80)
RDW: 16.2 % — ABNORMAL HIGH (ref 11.0–15.0)
WBC: 6.8 10*3/uL (ref 3.8–10.8)

## 2016-08-10 LAB — CK: Total CK: 158 U/L (ref 44–196)

## 2016-08-10 LAB — AMYLASE: Amylase: 98 U/L (ref 21–101)

## 2016-08-10 LAB — PHOSPHORUS: PHOSPHORUS: 3.8 mg/dL (ref 2.5–4.5)

## 2016-08-10 LAB — HIV ANTIBODY (ROUTINE TESTING W REFLEX): HIV 1&2 Ab, 4th Generation: NONREACTIVE

## 2016-08-10 NOTE — Progress Notes (Signed)
Study: A Phase 2b/3 Double Blind Safety and Efficacy Study of Injectable Cabotegravir compared to Daily Oral Tenofovir Disoproxil Fumarate/Emtricitabine (TDF/FTC), For Pre-Exposure Prophylaxis in HIV-Uninfected Cisgender Men and Transgender Women who have sex with Men.  Medication: Investigational Injectable Cabotegravir/placebo compared to Truvada/placebo. Duration: Around 4 years.  [place] is here for week 73. He denies any changes since last study visit. #53 pills returned of oral study product. Questionnaires completed. HIV rapid confirmed non-reactive. Injection given in left gluteal muscle with no problem. Oral study product dispensed. He received $50 gift card for visit. Next appointment scheduled for 7/13 at 8:30.

## 2016-08-10 NOTE — Progress Notes (Signed)
Met ppt in exam room to introduce self and explained role as Production designer, theatre/television/film. Ppt gave permission to send mail (confirmed mailing address). Ppt prefers call or text reminder for appointments. Asked ppt about mychart and explained functionality for checking labs; sent text request to ppt's cell phone via Epic.

## 2016-08-21 ENCOUNTER — Encounter (INDEPENDENT_AMBULATORY_CARE_PROVIDER_SITE_OTHER): Payer: BLUE CROSS/BLUE SHIELD | Admitting: *Deleted

## 2016-08-21 VITALS — BP 114/73 | HR 54 | Temp 98.2°F | Wt 150.0 lb

## 2016-08-21 DIAGNOSIS — Z006 Encounter for examination for normal comparison and control in clinical research program: Secondary | ICD-10-CM

## 2016-08-21 LAB — CBC WITH DIFFERENTIAL/PLATELET
Basophils Absolute: 43 cells/uL (ref 0–200)
Basophils Relative: 1 %
EOS PCT: 3 %
Eosinophils Absolute: 129 cells/uL (ref 15–500)
HCT: 41.3 % (ref 38.5–50.0)
Hemoglobin: 13 g/dL — ABNORMAL LOW (ref 13.2–17.1)
Lymphocytes Relative: 44 %
Lymphs Abs: 1892 cells/uL (ref 850–3900)
MCH: 23.6 pg — AB (ref 27.0–33.0)
MCHC: 31.5 g/dL — ABNORMAL LOW (ref 32.0–36.0)
MCV: 74.8 fL — ABNORMAL LOW (ref 80.0–100.0)
MONOS PCT: 7 %
MPV: 9.6 fL (ref 7.5–12.5)
Monocytes Absolute: 301 cells/uL (ref 200–950)
NEUTROS ABS: 1935 {cells}/uL (ref 1500–7800)
Neutrophils Relative %: 45 %
PLATELETS: 206 10*3/uL (ref 140–400)
RBC: 5.52 MIL/uL (ref 4.20–5.80)
RDW: 15.8 % — ABNORMAL HIGH (ref 11.0–15.0)
WBC: 4.3 10*3/uL (ref 3.8–10.8)

## 2016-08-21 NOTE — Progress Notes (Signed)
Study: A Phase 2b/3 Double Blind Safety and Efficacy Study of Injectable Cabotegravir compared to Daily Oral Tenofovir Disoproxil Fumarate/Emtricitabine (TDF/FTC), For Pre-Exposure Prophylaxis in HIV-Uninfected Cisgender Men and Transgender Women who have sex with Men.  Medication: Investigational Injectable Cabotegravir/placebo compared to Truvada/placebo. Duration: Around 4 years.  Keith Becker is here for week 75. He had one day of injection site tenderness. Questionnaire complete. Blood drawn. He received $50 gift card for visit. Next appointment scheduled for 8/22 @ 9am.

## 2016-08-22 LAB — COMPREHENSIVE METABOLIC PANEL
ALK PHOS: 47 U/L (ref 40–115)
ALT: 16 U/L (ref 9–46)
AST: 13 U/L (ref 10–40)
Albumin: 4.2 g/dL (ref 3.6–5.1)
BUN: 9 mg/dL (ref 7–25)
CHLORIDE: 107 mmol/L (ref 98–110)
CO2: 21 mmol/L (ref 20–31)
Calcium: 9.1 mg/dL (ref 8.6–10.3)
Creat: 0.92 mg/dL (ref 0.60–1.35)
GLUCOSE: 91 mg/dL (ref 65–99)
POTASSIUM: 4.1 mmol/L (ref 3.5–5.3)
Sodium: 141 mmol/L (ref 135–146)
Total Bilirubin: 0.4 mg/dL (ref 0.2–1.2)
Total Protein: 6.5 g/dL (ref 6.1–8.1)

## 2016-08-22 LAB — LIPASE: Lipase: 9 U/L (ref 7–60)

## 2016-08-22 LAB — AMYLASE: AMYLASE: 83 U/L (ref 21–101)

## 2016-08-22 LAB — HIV ANTIBODY (ROUTINE TESTING W REFLEX): HIV 1&2 Ab, 4th Generation: NONREACTIVE

## 2016-08-22 LAB — CK: CK TOTAL: 109 U/L (ref 44–196)

## 2016-08-22 LAB — PHOSPHORUS: Phosphorus: 3.4 mg/dL (ref 2.5–4.5)

## 2016-09-30 ENCOUNTER — Encounter (INDEPENDENT_AMBULATORY_CARE_PROVIDER_SITE_OTHER): Payer: Self-pay | Admitting: *Deleted

## 2016-09-30 VITALS — BP 117/75 | HR 56 | Temp 97.6°F | Wt 152.0 lb

## 2016-09-30 DIAGNOSIS — Z006 Encounter for examination for normal comparison and control in clinical research program: Secondary | ICD-10-CM

## 2016-09-30 LAB — CBC WITH DIFFERENTIAL/PLATELET
BASOS PCT: 0 %
Basophils Absolute: 0 cells/uL (ref 0–200)
EOS ABS: 180 {cells}/uL (ref 15–500)
EOS PCT: 4 %
HCT: 42 % (ref 38.5–50.0)
HEMOGLOBIN: 13 g/dL — AB (ref 13.2–17.1)
Lymphocytes Relative: 40 %
Lymphs Abs: 1800 cells/uL (ref 850–3900)
MCH: 23.5 pg — AB (ref 27.0–33.0)
MCHC: 31 g/dL — ABNORMAL LOW (ref 32.0–36.0)
MCV: 75.9 fL — ABNORMAL LOW (ref 80.0–100.0)
MONO ABS: 405 {cells}/uL (ref 200–950)
MONOS PCT: 9 %
MPV: 10 fL (ref 7.5–12.5)
Neutro Abs: 2115 cells/uL (ref 1500–7800)
Neutrophils Relative %: 47 %
PLATELETS: 203 10*3/uL (ref 140–400)
RBC: 5.53 MIL/uL (ref 4.20–5.80)
RDW: 15.9 % — ABNORMAL HIGH (ref 11.0–15.0)
WBC: 4.5 10*3/uL (ref 3.8–10.8)

## 2016-10-01 LAB — COMPREHENSIVE METABOLIC PANEL
ALBUMIN: 4.5 g/dL (ref 3.6–5.1)
ALK PHOS: 51 U/L (ref 40–115)
ALT: 15 U/L (ref 9–46)
AST: 14 U/L (ref 10–40)
BILIRUBIN TOTAL: 0.3 mg/dL (ref 0.2–1.2)
BUN: 12 mg/dL (ref 7–25)
CHLORIDE: 107 mmol/L (ref 98–110)
CO2: 22 mmol/L (ref 20–32)
CREATININE: 0.95 mg/dL (ref 0.60–1.35)
Calcium: 9.6 mg/dL (ref 8.6–10.3)
Glucose, Bld: 105 mg/dL — ABNORMAL HIGH (ref 65–99)
Potassium: 4.2 mmol/L (ref 3.5–5.3)
SODIUM: 142 mmol/L (ref 135–146)
TOTAL PROTEIN: 6.8 g/dL (ref 6.1–8.1)

## 2016-10-01 LAB — CK: CK TOTAL: 338 U/L — AB (ref 44–196)

## 2016-10-01 LAB — RPR

## 2016-10-01 LAB — HIV ANTIBODY (ROUTINE TESTING W REFLEX): HIV: NONREACTIVE

## 2016-10-01 LAB — PHOSPHORUS: PHOSPHORUS: 3.3 mg/dL (ref 2.5–4.5)

## 2016-10-01 LAB — AMYLASE: Amylase: 114 U/L — ABNORMAL HIGH (ref 21–101)

## 2016-10-01 LAB — LIPASE: Lipase: 9 U/L (ref 7–60)

## 2016-10-02 LAB — GC/CHLAMYDIA PROBE AMP
CT Probe RNA: NOT DETECTED
GC Probe RNA: NOT DETECTED

## 2016-10-02 NOTE — Progress Notes (Signed)
Study: A Phase 2b/3 Double Blind Safety and Efficacy Study of Injectable Cabotegravir compared to Daily Oral Tenofovir Disoproxil Fumarate/Emtricitabine (TDF/FTC), For Pre-Exposure Prophylaxis in HIV-Uninfected Cisgender Men and Transgender Women who have sex with Men.  Medication: Investigational Injectable Cabotegravir/placebo compared to Truvada/placebo. Duration: Around 4 years.  Keith Becker is here for week 81. He denies any new issues. HIV rapid confirmed non-reactive. He returned #49 pills of oral study product. Questionnaires completed. Injection given in right gluteal muscle with no problems. Oral study product dispensed. He received $50 gift card for visit. Next appt scheduled for 9/4.

## 2016-10-03 LAB — CT/NG RNA, TMA RECTAL
Chlamydia Trachomatis RNA: NOT DETECTED
Neisseria Gonorrhoeae RNA: NOT DETECTED

## 2016-10-05 ENCOUNTER — Ambulatory Visit: Payer: BLUE CROSS/BLUE SHIELD | Admitting: Infectious Diseases

## 2016-10-05 ENCOUNTER — Telehealth: Payer: Self-pay | Admitting: *Deleted

## 2016-10-05 ENCOUNTER — Ambulatory Visit: Payer: BLUE CROSS/BLUE SHIELD

## 2016-10-05 NOTE — Telephone Encounter (Signed)
This was an error. Patient was positive five months ago and he is not positive now. Keith Becker

## 2016-10-05 NOTE — Telephone Encounter (Signed)
Patient notified that he tested positive for gonorrhea and per Dr. Daiva Eves he needs treatment with rocephin 250 mg IM and azithromycin 1 gram oral. He will come in today for treatment. Please see MD order under lab results. Wendall Mola

## 2016-10-06 NOTE — Progress Notes (Unsigned)
Patient's lab results on 09-30-16 do not indicate he is positive for any sexually transmitted diseases.  Please disregard lab comment on 10-04-16 by Dr Daiva Eves . Laurell Josephs, RN

## 2016-10-08 NOTE — Telephone Encounter (Signed)
I am sorry. There must be something odd in how this is being displayed then

## 2016-10-13 ENCOUNTER — Encounter: Payer: BLUE CROSS/BLUE SHIELD | Admitting: *Deleted

## 2016-10-13 VITALS — BP 105/52 | HR 54 | Temp 98.1°F | Wt 152.0 lb

## 2016-10-13 DIAGNOSIS — Z006 Encounter for examination for normal comparison and control in clinical research program: Secondary | ICD-10-CM

## 2016-10-13 LAB — CBC WITH DIFFERENTIAL/PLATELET
BASOS ABS: 0 {cells}/uL (ref 0–200)
Basophils Relative: 0 %
EOS PCT: 2 %
Eosinophils Absolute: 94 cells/uL (ref 15–500)
HCT: 39.8 % (ref 38.5–50.0)
Hemoglobin: 12.4 g/dL — ABNORMAL LOW (ref 13.2–17.1)
LYMPHS PCT: 39 %
Lymphs Abs: 1833 cells/uL (ref 850–3900)
MCH: 23.4 pg — AB (ref 27.0–33.0)
MCHC: 31.2 g/dL — AB (ref 32.0–36.0)
MCV: 75.1 fL — ABNORMAL LOW (ref 80.0–100.0)
MPV: 9.7 fL (ref 7.5–12.5)
Monocytes Absolute: 423 cells/uL (ref 200–950)
Monocytes Relative: 9 %
NEUTROS PCT: 50 %
Neutro Abs: 2350 cells/uL (ref 1500–7800)
Platelets: 173 10*3/uL (ref 140–400)
RBC: 5.3 MIL/uL (ref 4.20–5.80)
RDW: 16.2 % — AB (ref 11.0–15.0)
WBC: 4.7 10*3/uL (ref 3.8–10.8)

## 2016-10-13 NOTE — Progress Notes (Signed)
Study: A Phase 2b/3 Double Blind Safety and Efficacy Study of Injectable Cabotegravir compared to Daily Oral Tenofovir Disoproxil Fumarate/Emtricitabine (TDF/FTC), For Pre-Exposure Prophylaxis in HIV-Uninfected Cisgender Men and Transgender Women who have sex with Men.  Medication: Investigational Injectable Cabotegravir/placebo compared to Truvada/placebo. Duration: Around 4 years.  Keith Becker is here for week 83. He reports no injection site reaction and no changes since last visit. HIV rapid confirmed non-reactive. Next appointment scheduled for 10/24.

## 2016-10-14 LAB — COMPREHENSIVE METABOLIC PANEL
ALK PHOS: 46 U/L (ref 40–115)
ALT: 11 U/L (ref 9–46)
AST: 11 U/L (ref 10–40)
Albumin: 4.1 g/dL (ref 3.6–5.1)
BUN: 10 mg/dL (ref 7–25)
CALCIUM: 8.9 mg/dL (ref 8.6–10.3)
CHLORIDE: 105 mmol/L (ref 98–110)
CO2: 25 mmol/L (ref 20–32)
Creat: 0.81 mg/dL (ref 0.60–1.35)
GLUCOSE: 128 mg/dL — AB (ref 65–99)
POTASSIUM: 3.8 mmol/L (ref 3.5–5.3)
Sodium: 139 mmol/L (ref 135–146)
Total Bilirubin: 0.5 mg/dL (ref 0.2–1.2)
Total Protein: 6.1 g/dL (ref 6.1–8.1)

## 2016-10-14 LAB — PHOSPHORUS: Phosphorus: 3.5 mg/dL (ref 2.5–4.5)

## 2016-10-14 LAB — LIPASE: Lipase: 10 U/L (ref 7–60)

## 2016-10-14 LAB — HIV ANTIBODY (ROUTINE TESTING W REFLEX): HIV 1&2 Ab, 4th Generation: NONREACTIVE

## 2016-10-14 LAB — AMYLASE: AMYLASE: 86 U/L (ref 21–101)

## 2016-10-14 LAB — CK: CK TOTAL: 121 U/L (ref 44–196)

## 2016-12-02 ENCOUNTER — Encounter (INDEPENDENT_AMBULATORY_CARE_PROVIDER_SITE_OTHER): Payer: BLUE CROSS/BLUE SHIELD | Admitting: *Deleted

## 2016-12-02 VITALS — BP 129/80 | HR 58 | Temp 99.1°F | Wt 154.5 lb

## 2016-12-02 DIAGNOSIS — Z006 Encounter for examination for normal comparison and control in clinical research program: Secondary | ICD-10-CM

## 2016-12-02 LAB — CBC WITH DIFFERENTIAL/PLATELET
Basophils Absolute: 39 cells/uL (ref 0–200)
Basophils Relative: 0.9 %
EOS PCT: 4.7 %
Eosinophils Absolute: 202 cells/uL (ref 15–500)
HEMATOCRIT: 41.7 % (ref 38.5–50.0)
HEMOGLOBIN: 13.1 g/dL — AB (ref 13.2–17.1)
LYMPHS ABS: 1707 {cells}/uL (ref 850–3900)
MCH: 23 pg — AB (ref 27.0–33.0)
MCHC: 31.4 g/dL — AB (ref 32.0–36.0)
MCV: 73.2 fL — ABNORMAL LOW (ref 80.0–100.0)
MPV: 11.2 fL (ref 7.5–12.5)
Monocytes Relative: 11.2 %
NEUTROS ABS: 1871 {cells}/uL (ref 1500–7800)
NEUTROS PCT: 43.5 %
Platelets: 203 10*3/uL (ref 140–400)
RBC: 5.7 10*6/uL (ref 4.20–5.80)
RDW: 14.6 % (ref 11.0–15.0)
Total Lymphocyte: 39.7 %
WBC mixed population: 482 cells/uL (ref 200–950)
WBC: 4.3 10*3/uL (ref 3.8–10.8)

## 2016-12-02 LAB — COMPREHENSIVE METABOLIC PANEL
AG RATIO: 1.8 (calc) (ref 1.0–2.5)
ALKALINE PHOSPHATASE (APISO): 47 U/L (ref 40–115)
ALT: 16 U/L (ref 9–46)
AST: 13 U/L (ref 10–40)
Albumin: 4.4 g/dL (ref 3.6–5.1)
BILIRUBIN TOTAL: 0.7 mg/dL (ref 0.2–1.2)
BUN: 13 mg/dL (ref 7–25)
CALCIUM: 9.2 mg/dL (ref 8.6–10.3)
CO2: 28 mmol/L (ref 20–32)
Chloride: 104 mmol/L (ref 98–110)
Creat: 0.85 mg/dL (ref 0.60–1.35)
GLOBULIN: 2.5 g/dL (ref 1.9–3.7)
GLUCOSE: 127 mg/dL — AB (ref 65–99)
Potassium: 3.9 mmol/L (ref 3.5–5.3)
Sodium: 139 mmol/L (ref 135–146)
Total Protein: 6.9 g/dL (ref 6.1–8.1)

## 2016-12-02 LAB — PHOSPHORUS: PHOSPHORUS: 3.1 mg/dL (ref 2.5–4.5)

## 2016-12-02 LAB — AMYLASE: AMYLASE: 91 U/L (ref 21–101)

## 2016-12-02 LAB — CK: Total CK: 144 U/L (ref 44–196)

## 2016-12-02 LAB — LIPASE: LIPASE: 8 U/L (ref 7–60)

## 2016-12-02 LAB — HIV ANTIBODY (ROUTINE TESTING W REFLEX): HIV: NONREACTIVE

## 2016-12-02 NOTE — Progress Notes (Signed)
Study: A Phase 2b/3 Double Blind Safety and Efficacy Study of Injectable Cabotegravir compared to Daily Oral Tenofovir Disoproxil Fumarate/Emtricitabine (TDF/FTC), For Pre-Exposure Prophylaxis in HIV-Uninfected Cisgender Men and Transgender Women who have sex with Men.  Medication: Investigational Injectable Cabotegravir/placebo compared to Truvada/placebo. Duration: Around 4 years.  Keith Becker is here for week 89. After verifying the correct version I explained/reviewed version 2.0 informed consent in the language that he understood. Risk, benefits, responsibilities, and other options were reviewed. I answered his questions. Comprehension was assessed. He was given adequate time to consider his options. He verbalized understanding and signed the consent witnessed by me. He denies any new issues. Assessment unchanged. Questionnaires completed. HIV rapid confirmed non-reactive. HE returned #57 pills. We discussed adherence as he has recently started to forget to take them on a daily basis. We discussed importance of taking on a daily basis and he verbalized understanding. We set a reminder on his phone that will alarm him when he is at home. He received injection in right gluteal muscle with no problems. Oral study medication was dispensed. HE received $50 gift card for visit as well as $25 gift card for week 57 visit per new ICF. Next appointment scheduled for 11/7.

## 2016-12-16 ENCOUNTER — Other Ambulatory Visit: Payer: Self-pay | Admitting: Infectious Disease

## 2016-12-16 ENCOUNTER — Encounter (INDEPENDENT_AMBULATORY_CARE_PROVIDER_SITE_OTHER): Payer: BLUE CROSS/BLUE SHIELD | Admitting: *Deleted

## 2016-12-16 VITALS — BP 120/78 | HR 56 | Temp 98.7°F | Wt 156.0 lb

## 2016-12-16 DIAGNOSIS — Z006 Encounter for examination for normal comparison and control in clinical research program: Secondary | ICD-10-CM

## 2016-12-16 NOTE — Progress Notes (Signed)
Study: A Phase 2b/3 Double Blind Safety and Efficacy Study of Injectable Cabotegravir compared to Daily Oral Tenofovir Disoproxil Fumarate/Emtricitabine (TDF/FTC), For Pre-Exposure Prophylaxis in HIV-Uninfected Cisgender Men and Transgender Women who have sex with Men.  Medication: Investigational Injectable Cabotegravir/placebo compared to Truvada/placebo. Duration: Around 4 years.  Keith Becker is here for week 91. He had one day of injection site swelling. This has resolved. HIV Rapid confirmed non-reactive. Questionnaire completed. He received $50 gift card for visit. Next appointment scheduled for December

## 2016-12-17 LAB — CBC WITH DIFFERENTIAL/PLATELET
BASOS ABS: 38 {cells}/uL (ref 0–200)
Basophils Relative: 0.8 %
Eosinophils Absolute: 89 cells/uL (ref 15–500)
Eosinophils Relative: 1.9 %
HEMATOCRIT: 42.3 % (ref 38.5–50.0)
HEMOGLOBIN: 13.3 g/dL (ref 13.2–17.1)
LYMPHS ABS: 1565 {cells}/uL (ref 850–3900)
MCH: 23 pg — ABNORMAL LOW (ref 27.0–33.0)
MCHC: 31.4 g/dL — ABNORMAL LOW (ref 32.0–36.0)
MCV: 73.2 fL — AB (ref 80.0–100.0)
MPV: 10 fL (ref 7.5–12.5)
Monocytes Relative: 9.1 %
NEUTROS ABS: 2580 {cells}/uL (ref 1500–7800)
NEUTROS PCT: 54.9 %
Platelets: 214 10*3/uL (ref 140–400)
RBC: 5.78 10*6/uL (ref 4.20–5.80)
RDW: 14.9 % (ref 11.0–15.0)
Total Lymphocyte: 33.3 %
WBC: 4.7 10*3/uL (ref 3.8–10.8)
WBCMIX: 428 {cells}/uL (ref 200–950)

## 2016-12-17 LAB — COMPREHENSIVE METABOLIC PANEL
AG RATIO: 1.7 (calc) (ref 1.0–2.5)
ALBUMIN MSPROF: 4.4 g/dL (ref 3.6–5.1)
ALKALINE PHOSPHATASE (APISO): 53 U/L (ref 40–115)
ALT: 24 U/L (ref 9–46)
AST: 21 U/L (ref 10–40)
BILIRUBIN TOTAL: 0.7 mg/dL (ref 0.2–1.2)
BUN: 10 mg/dL (ref 7–25)
CALCIUM: 9.4 mg/dL (ref 8.6–10.3)
CHLORIDE: 105 mmol/L (ref 98–110)
CO2: 28 mmol/L (ref 20–32)
Creat: 0.82 mg/dL (ref 0.60–1.35)
GLOBULIN: 2.6 g/dL (ref 1.9–3.7)
Glucose, Bld: 102 mg/dL — ABNORMAL HIGH (ref 65–99)
POTASSIUM: 4.1 mmol/L (ref 3.5–5.3)
Sodium: 141 mmol/L (ref 135–146)
Total Protein: 7 g/dL (ref 6.1–8.1)

## 2016-12-17 LAB — PHOSPHORUS: Phosphorus: 3.8 mg/dL (ref 2.5–4.5)

## 2016-12-17 LAB — AMYLASE: Amylase: 99 U/L (ref 21–101)

## 2016-12-17 LAB — HIV ANTIBODY (ROUTINE TESTING W REFLEX): HIV 1&2 Ab, 4th Generation: NONREACTIVE

## 2016-12-17 LAB — LIPASE: Lipase: 8 U/L (ref 7–60)

## 2016-12-17 LAB — CK: Total CK: 166 U/L (ref 44–196)

## 2017-01-20 ENCOUNTER — Encounter (INDEPENDENT_AMBULATORY_CARE_PROVIDER_SITE_OTHER): Payer: BLUE CROSS/BLUE SHIELD | Admitting: *Deleted

## 2017-01-20 VITALS — BP 116/77 | HR 55 | Temp 98.4°F | Wt 155.8 lb

## 2017-01-20 DIAGNOSIS — Z006 Encounter for examination for normal comparison and control in clinical research program: Secondary | ICD-10-CM

## 2017-01-21 LAB — COMPREHENSIVE METABOLIC PANEL
AG RATIO: 1.7 (calc) (ref 1.0–2.5)
ALBUMIN MSPROF: 4.5 g/dL (ref 3.6–5.1)
ALKALINE PHOSPHATASE (APISO): 54 U/L (ref 40–115)
ALT: 15 U/L (ref 9–46)
AST: 15 U/L (ref 10–40)
BILIRUBIN TOTAL: 0.5 mg/dL (ref 0.2–1.2)
BUN: 14 mg/dL (ref 7–25)
CALCIUM: 9.3 mg/dL (ref 8.6–10.3)
CHLORIDE: 105 mmol/L (ref 98–110)
CO2: 27 mmol/L (ref 20–32)
Creat: 0.92 mg/dL (ref 0.60–1.35)
GLOBULIN: 2.6 g/dL (ref 1.9–3.7)
Glucose, Bld: 91 mg/dL (ref 65–99)
POTASSIUM: 4.3 mmol/L (ref 3.5–5.3)
SODIUM: 140 mmol/L (ref 135–146)
TOTAL PROTEIN: 7.1 g/dL (ref 6.1–8.1)

## 2017-01-21 LAB — CBC WITH DIFFERENTIAL/PLATELET
BASOS PCT: 0.5 %
Basophils Absolute: 20 cells/uL (ref 0–200)
EOS ABS: 117 {cells}/uL (ref 15–500)
Eosinophils Relative: 3 %
HEMATOCRIT: 42.7 % (ref 38.5–50.0)
HEMOGLOBIN: 13.2 g/dL (ref 13.2–17.1)
LYMPHS ABS: 1673 {cells}/uL (ref 850–3900)
MCH: 22.8 pg — AB (ref 27.0–33.0)
MCHC: 30.9 g/dL — ABNORMAL LOW (ref 32.0–36.0)
MCV: 73.7 fL — AB (ref 80.0–100.0)
MPV: 11.3 fL (ref 7.5–12.5)
Monocytes Relative: 10.2 %
Neutro Abs: 1693 cells/uL (ref 1500–7800)
Neutrophils Relative %: 43.4 %
PLATELETS: 213 10*3/uL (ref 140–400)
RBC: 5.79 10*6/uL (ref 4.20–5.80)
RDW: 14.4 % (ref 11.0–15.0)
TOTAL LYMPHOCYTE: 42.9 %
WBC: 3.9 10*3/uL (ref 3.8–10.8)
WBCMIX: 398 {cells}/uL (ref 200–950)

## 2017-01-21 LAB — HIV ANTIBODY (ROUTINE TESTING W REFLEX): HIV 1&2 Ab, 4th Generation: NONREACTIVE

## 2017-01-21 LAB — LIPASE: LIPASE: 6 U/L — AB (ref 7–60)

## 2017-01-21 LAB — CK: Total CK: 212 U/L — ABNORMAL HIGH (ref 44–196)

## 2017-01-21 LAB — AMYLASE: Amylase: 85 U/L (ref 21–101)

## 2017-01-21 LAB — PHOSPHORUS: Phosphorus: 3.5 mg/dL (ref 2.5–4.5)

## 2017-01-22 MED ORDER — ACYCLOVIR 200 MG PO CAPS: 200.0000 mg | ORAL_CAPSULE | Freq: Every day | ORAL | 0 refills | Status: DC

## 2017-01-22 NOTE — Addendum Note (Signed)
Addended by: Nicolasa DuckingEPPERSON, ELISHA S on: 01/22/2017 11:02 AM   Modules accepted: Orders

## 2017-01-22 NOTE — Addendum Note (Signed)
Addended by: Nicolasa DuckingEPPERSON, ELISHA S on: 01/22/2017 10:58 AM   Modules accepted: Orders

## 2017-01-22 NOTE — Progress Notes (Signed)
Study: A Phase 2b/3 Double Blind Safety and Efficacy Study of Injectable Cabotegravir compared to Daily Oral Tenofovir Disoproxil Fumarate/Emtricitabine (TDF/FTC), For Pre-Exposure Prophylaxis in HIV-Uninfected Cisgender Men and Transgender Women who have sex with Men.  Medication: Investigational Injectable Cabotegravir/placebo compared to Truvada/placebo. Duration: Around 4 years.  Keith Becker is here for week 97. He denies any issues. 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 HIV rapid confirmed non-reactive. Questionnaires completed. Injection given in gluteal muscle with no problems. Oral study product dispensed. He received $50 gift card for visit. Next appointment in January.

## 2017-02-12 ENCOUNTER — Encounter (INDEPENDENT_AMBULATORY_CARE_PROVIDER_SITE_OTHER): Payer: BLUE CROSS/BLUE SHIELD | Admitting: *Deleted

## 2017-02-12 VITALS — BP 115/75 | HR 58 | Temp 98.4°F | Wt 156.0 lb

## 2017-02-12 DIAGNOSIS — Z006 Encounter for examination for normal comparison and control in clinical research program: Secondary | ICD-10-CM

## 2017-02-12 NOTE — Progress Notes (Signed)
Study: A Phase 2b/3 Double Blind Safety and Efficacy Study of Injectable Cabotegravir compared to Daily Oral Tenofovir Disoproxil Fumarate/Emtricitabine (TDF/FTC), For Pre-Exposure Prophylaxis in HIV-Uninfected Cisgender Men and Transgender Women who have sex with Men.  Medication: Investigational Injectable Cabotegravir/placebo compared to Truvada/placebo. Duration: Around 4 years.  Keith Becker is here for week 99. He denies any injection site reaction. HIV rapid confirmed non-reactive. No new issues or changes. He did receive a phone call from health department of potential syphilis exposure. He received Bicillin 2.4 million once a week prior to christmas. He received $50 gift card for visit. Next appt scheduled for 2/11.

## 2017-02-14 LAB — CBC WITH DIFFERENTIAL/PLATELET
BASOS PCT: 0.6 %
Basophils Absolute: 28 cells/uL (ref 0–200)
EOS ABS: 89 {cells}/uL (ref 15–500)
Eosinophils Relative: 1.9 %
HCT: 42.3 % (ref 38.5–50.0)
HEMOGLOBIN: 13.4 g/dL (ref 13.2–17.1)
LYMPHS ABS: 1283 {cells}/uL (ref 850–3900)
MCH: 23.2 pg — AB (ref 27.0–33.0)
MCHC: 31.7 g/dL — ABNORMAL LOW (ref 32.0–36.0)
MCV: 73.3 fL — AB (ref 80.0–100.0)
MPV: 10.9 fL (ref 7.5–12.5)
Monocytes Relative: 10.8 %
NEUTROS ABS: 2792 {cells}/uL (ref 1500–7800)
Neutrophils Relative %: 59.4 %
Platelets: 219 10*3/uL (ref 140–400)
RBC: 5.77 10*6/uL (ref 4.20–5.80)
RDW: 14.2 % (ref 11.0–15.0)
Total Lymphocyte: 27.3 %
WBC: 4.7 10*3/uL (ref 3.8–10.8)
WBCMIX: 508 {cells}/uL (ref 200–950)

## 2017-02-14 LAB — COMPREHENSIVE METABOLIC PANEL
AG Ratio: 1.6 (calc) (ref 1.0–2.5)
ALBUMIN MSPROF: 4.4 g/dL (ref 3.6–5.1)
ALKALINE PHOSPHATASE (APISO): 62 U/L (ref 40–115)
ALT: 14 U/L (ref 9–46)
AST: 14 U/L (ref 10–40)
BILIRUBIN TOTAL: 0.3 mg/dL (ref 0.2–1.2)
BUN: 11 mg/dL (ref 7–25)
CALCIUM: 9.5 mg/dL (ref 8.6–10.3)
CO2: 20 mmol/L (ref 20–32)
CREATININE: 0.81 mg/dL (ref 0.60–1.35)
Chloride: 106 mmol/L (ref 98–110)
GLOBULIN: 2.7 g/dL (ref 1.9–3.7)
Glucose, Bld: 110 mg/dL — ABNORMAL HIGH (ref 65–99)
POTASSIUM: 4.6 mmol/L (ref 3.5–5.3)
SODIUM: 142 mmol/L (ref 135–146)
Total Protein: 7.1 g/dL (ref 6.1–8.1)

## 2017-02-14 LAB — LIPASE: Lipase: <5 U/L — ABNORMAL LOW (ref 7–60)

## 2017-02-14 LAB — HIV ANTIBODY (ROUTINE TESTING W REFLEX): HIV: NONREACTIVE

## 2017-02-14 LAB — AMYLASE: Amylase: 106 U/L — ABNORMAL HIGH (ref 21–101)

## 2017-02-14 LAB — PHOSPHORUS: Phosphorus: 3.2 mg/dL (ref 2.5–4.5)

## 2017-02-14 LAB — CK: Total CK: 115 U/L (ref 44–196)

## 2017-03-23 ENCOUNTER — Encounter: Payer: BLUE CROSS/BLUE SHIELD | Admitting: *Deleted

## 2017-03-24 ENCOUNTER — Encounter (INDEPENDENT_AMBULATORY_CARE_PROVIDER_SITE_OTHER): Payer: Self-pay | Admitting: *Deleted

## 2017-03-24 VITALS — BP 109/73 | HR 73 | Temp 97.9°F | Wt 156.8 lb

## 2017-03-24 DIAGNOSIS — Z006 Encounter for examination for normal comparison and control in clinical research program: Secondary | ICD-10-CM

## 2017-03-24 NOTE — Progress Notes (Signed)
Study: A Phase 2b/3 Double Blind Safety and Efficacy Study of Injectable Cabotegravir compared to Daily Oral Tenofovir Disoproxil Fumarate/Emtricitabine (TDF/FTC), For Pre-Exposure Prophylaxis in HIV-Uninfected Cisgender Men and Transgender Women who have sex with Men.  Medication: Investigational Injectable Cabotegravir/placebo compared to Truvada/placebo. Duration: Around 4 years.  Keith Becker is here for week 105 visit. He returned 54 pills and admits to missing some doses but not sure how many. He said he had taken some out of an old bottle. States that he has several bottles of oral study medication at home. Instructed him to return those bottles and to start taking meds from the new bottles from today. Provided him with pill box to help with adherence. States that he did have a cold with sore throat, low grade fever and fatigue last week which lasted a few days. Rapid HIV non-reactive. Cabotegravir/placebo injection given (R) buttock. Site unremarkable. He will return in 2 weeks for safety visit.

## 2017-03-25 LAB — CBC WITH DIFFERENTIAL/PLATELET
Basophils Absolute: 32 {cells}/uL (ref 0–200)
Basophils Relative: 0.7 %
Eosinophils Absolute: 59 {cells}/uL (ref 15–500)
Eosinophils Relative: 1.3 %
HCT: 41.8 % (ref 38.5–50.0)
Hemoglobin: 13.3 g/dL (ref 13.2–17.1)
Lymphs Abs: 1715 {cells}/uL (ref 850–3900)
MCH: 23.1 pg — ABNORMAL LOW (ref 27.0–33.0)
MCHC: 31.8 g/dL — ABNORMAL LOW (ref 32.0–36.0)
MCV: 72.6 fL — ABNORMAL LOW (ref 80.0–100.0)
MPV: 10.5 fL (ref 7.5–12.5)
Monocytes Relative: 10.6 %
Neutro Abs: 2219 {cells}/uL (ref 1500–7800)
Neutrophils Relative %: 49.3 %
Platelets: 249 10*3/uL (ref 140–400)
RBC: 5.76 Million/uL (ref 4.20–5.80)
RDW: 15.3 % — ABNORMAL HIGH (ref 11.0–15.0)
Total Lymphocyte: 38.1 %
WBC mixed population: 477 {cells}/uL (ref 200–950)
WBC: 4.5 10*3/uL (ref 3.8–10.8)

## 2017-03-25 LAB — RPR: RPR Ser Ql: NONREACTIVE

## 2017-03-25 LAB — COMPREHENSIVE METABOLIC PANEL WITH GFR
AG Ratio: 1.6 (calc) (ref 1.0–2.5)
ALT: 22 U/L (ref 9–46)
AST: 16 U/L (ref 10–40)
Albumin: 4.4 g/dL (ref 3.6–5.1)
Alkaline phosphatase (APISO): 56 U/L (ref 40–115)
BUN: 14 mg/dL (ref 7–25)
CO2: 27 mmol/L (ref 20–32)
Calcium: 9.5 mg/dL (ref 8.6–10.3)
Chloride: 106 mmol/L (ref 98–110)
Creat: 0.9 mg/dL (ref 0.60–1.35)
Globulin: 2.7 g/dL (ref 1.9–3.7)
Glucose, Bld: 83 mg/dL (ref 65–99)
Potassium: 4.2 mmol/L (ref 3.5–5.3)
Sodium: 142 mmol/L (ref 135–146)
Total Bilirubin: 0.6 mg/dL (ref 0.2–1.2)
Total Protein: 7.1 g/dL (ref 6.1–8.1)

## 2017-03-25 LAB — C. TRACHOMATIS/N. GONORRHOEAE RNA
C. trachomatis RNA, TMA: NOT DETECTED
N. gonorrhoeae RNA, TMA: NOT DETECTED

## 2017-03-25 LAB — URINALYSIS, ROUTINE W REFLEX MICROSCOPIC
Bilirubin Urine: NEGATIVE
GLUCOSE, UA: NEGATIVE
HGB URINE DIPSTICK: NEGATIVE
Ketones, ur: NEGATIVE
LEUKOCYTES UA: NEGATIVE
Nitrite: NEGATIVE
PH: 6.5 (ref 5.0–8.0)
Protein, ur: NEGATIVE
SPECIFIC GRAVITY, URINE: 1.027 (ref 1.001–1.03)

## 2017-03-25 LAB — HEPATITIS C ANTIBODY
Hepatitis C Ab: NONREACTIVE
SIGNAL TO CUT-OFF: 0.02 (ref <1.00)

## 2017-03-25 LAB — LIPID PANEL
CHOL/HDL RATIO: 3 (calc) (ref <5.0)
Cholesterol: 138 mg/dL (ref <200)
HDL: 46 mg/dL (ref >40)
LDL CHOLESTEROL (CALC): 81 mg/dL
NON-HDL CHOLESTEROL (CALC): 92 mg/dL (ref <130)
Triglycerides: 39 mg/dL (ref <150)

## 2017-03-25 LAB — AMYLASE: Amylase: 91 U/L (ref 21–101)

## 2017-03-25 LAB — PHOSPHORUS: PHOSPHORUS: 3.4 mg/dL (ref 2.5–4.5)

## 2017-03-25 LAB — LIPASE: Lipase: <5 U/L — ABNORMAL LOW (ref 7–60)

## 2017-03-25 LAB — HIV ANTIBODY (ROUTINE TESTING W REFLEX): HIV 1&2 Ab, 4th Generation: NONREACTIVE

## 2017-03-25 LAB — CK: Total CK: 133 U/L (ref 44–196)

## 2017-03-27 LAB — CT/NG RNA, TMA RECTAL
Chlamydia Trachomatis RNA: NOT DETECTED
Neisseria Gonorrhoeae RNA: DETECTED — AB

## 2017-03-29 ENCOUNTER — Telehealth: Payer: Self-pay | Admitting: *Deleted

## 2017-03-29 NOTE — Telephone Encounter (Signed)
Notified Keith Becker of his positive rectal gonorrhea result. Referred him to the [facility] ([phone]) for treatment. Instructed him to notify any recent partners so they can be tested and treated as well.

## 2017-04-07 ENCOUNTER — Encounter: Payer: BLUE CROSS/BLUE SHIELD | Admitting: *Deleted

## 2017-04-08 ENCOUNTER — Encounter (INDEPENDENT_AMBULATORY_CARE_PROVIDER_SITE_OTHER): Payer: Self-pay | Admitting: *Deleted

## 2017-04-08 VITALS — BP 109/70 | HR 70 | Temp 98.3°F | Wt 154.0 lb

## 2017-04-08 DIAGNOSIS — Z006 Encounter for examination for normal comparison and control in clinical research program: Secondary | ICD-10-CM

## 2017-04-08 NOTE — Progress Notes (Signed)
Study: A Phase 2b/3 Double Blind Safety and Efficacy Study of Injectable Cabotegravir compared to Daily Oral Tenofovir Disoproxil Fumarate/Emtricitabine (TDF/FTC), For Pre-Exposure Prophylaxis in HIV-Uninfected Cisgender Men and Transgender Women who have sex with Men.  Medication: Investigational Injectable Cabotegravir/placebo compared to Truvada/placebo. Duration: Around 4 years.  Keith Becker is here for week 107. States mild tenderness after his last injection. No other complaints or concerns. Rapid HIV non-reactive. He will return in April for his next injection.

## 2017-04-09 LAB — COMPREHENSIVE METABOLIC PANEL
AG RATIO: 1.6 (calc) (ref 1.0–2.5)
ALBUMIN MSPROF: 4.4 g/dL (ref 3.6–5.1)
ALT: 14 U/L (ref 9–46)
AST: 15 U/L (ref 10–40)
Alkaline phosphatase (APISO): 57 U/L (ref 40–115)
BILIRUBIN TOTAL: 0.5 mg/dL (ref 0.2–1.2)
BUN: 11 mg/dL (ref 7–25)
CALCIUM: 9.5 mg/dL (ref 8.6–10.3)
CHLORIDE: 107 mmol/L (ref 98–110)
CO2: 29 mmol/L (ref 20–32)
Creat: 0.89 mg/dL (ref 0.60–1.35)
GLOBULIN: 2.7 g/dL (ref 1.9–3.7)
Glucose, Bld: 73 mg/dL (ref 65–99)
POTASSIUM: 3.8 mmol/L (ref 3.5–5.3)
SODIUM: 143 mmol/L (ref 135–146)
TOTAL PROTEIN: 7.1 g/dL (ref 6.1–8.1)

## 2017-04-09 LAB — CBC WITH DIFFERENTIAL/PLATELET
Basophils Absolute: 19 cells/uL (ref 0–200)
Basophils Relative: 0.4 %
EOS PCT: 1.3 %
Eosinophils Absolute: 61 cells/uL (ref 15–500)
HEMATOCRIT: 43.8 % (ref 38.5–50.0)
Hemoglobin: 13.7 g/dL (ref 13.2–17.1)
LYMPHS ABS: 1885 {cells}/uL (ref 850–3900)
MCH: 23 pg — ABNORMAL LOW (ref 27.0–33.0)
MCHC: 31.3 g/dL — ABNORMAL LOW (ref 32.0–36.0)
MCV: 73.5 fL — ABNORMAL LOW (ref 80.0–100.0)
MPV: 11.2 fL (ref 7.5–12.5)
Monocytes Relative: 9.8 %
NEUTROS PCT: 48.4 %
Neutro Abs: 2275 cells/uL (ref 1500–7800)
Platelets: 227 10*3/uL (ref 140–400)
RBC: 5.96 10*6/uL — AB (ref 4.20–5.80)
RDW: 14.7 % (ref 11.0–15.0)
Total Lymphocyte: 40.1 %
WBC mixed population: 461 cells/uL (ref 200–950)
WBC: 4.7 10*3/uL (ref 3.8–10.8)

## 2017-04-09 LAB — PHOSPHORUS: Phosphorus: 3.4 mg/dL (ref 2.5–4.5)

## 2017-04-09 LAB — HIV ANTIBODY (ROUTINE TESTING W REFLEX): HIV 1&2 Ab, 4th Generation: NONREACTIVE

## 2017-04-09 LAB — AMYLASE: Amylase: 76 U/L (ref 21–101)

## 2017-04-09 LAB — CK: Total CK: 147 U/L (ref 44–196)

## 2017-04-09 LAB — LIPASE: Lipase: <5 U/L — ABNORMAL LOW (ref 7–60)

## 2017-05-20 ENCOUNTER — Encounter: Payer: BLUE CROSS/BLUE SHIELD | Admitting: *Deleted

## 2017-05-21 ENCOUNTER — Encounter (INDEPENDENT_AMBULATORY_CARE_PROVIDER_SITE_OTHER): Payer: Self-pay | Admitting: *Deleted

## 2017-05-21 VITALS — BP 117/69 | HR 53 | Temp 97.9°F | Wt 160.2 lb

## 2017-05-21 DIAGNOSIS — Z006 Encounter for examination for normal comparison and control in clinical research program: Secondary | ICD-10-CM

## 2017-05-21 NOTE — Progress Notes (Signed)
Study: A Phase 2b/3 Double Blind Safety and Efficacy Study of Injectable Cabotegravir compared to Daily Oral Tenofovir Disoproxil Fumarate/Emtricitabine (TDF/FTC), For Pre-Exposure Prophylaxis in HIV-Uninfected Cisgender Men and Transgender Women who have sex with Men.  Medication: Investigational Injectable Cabotegravir/placebo compared to Truvada/placebo. Duration: Around 4 years.  This is an injection visit and he did bring in his oral study medication. He returned 63 pills and admits to missing some doses. He said he had taken some out of an old bottle before starting on new bottles that he received at his last refill. We discussed adherence strategies he could try over the next few weeks. No new complaints or concerns. Rapid HIV non-reactive. Injection of study medication given (L) buttock. Site unremarkable. He will return in 2 weeks for next study visit.

## 2017-05-22 LAB — COMPREHENSIVE METABOLIC PANEL
AG RATIO: 1.8 (calc) (ref 1.0–2.5)
ALBUMIN MSPROF: 4.2 g/dL (ref 3.6–5.1)
ALT: 12 U/L (ref 9–46)
AST: 14 U/L (ref 10–40)
Alkaline phosphatase (APISO): 49 U/L (ref 40–115)
BUN: 11 mg/dL (ref 7–25)
CO2: 28 mmol/L (ref 20–32)
CREATININE: 0.85 mg/dL (ref 0.60–1.35)
Calcium: 9.1 mg/dL (ref 8.6–10.3)
Chloride: 109 mmol/L (ref 98–110)
GLUCOSE: 109 mg/dL — AB (ref 65–99)
Globulin: 2.3 g/dL (calc) (ref 1.9–3.7)
POTASSIUM: 4.2 mmol/L (ref 3.5–5.3)
SODIUM: 140 mmol/L (ref 135–146)
TOTAL PROTEIN: 6.5 g/dL (ref 6.1–8.1)
Total Bilirubin: 0.3 mg/dL (ref 0.2–1.2)

## 2017-05-22 LAB — AMYLASE: Amylase: 99 U/L (ref 21–101)

## 2017-05-22 LAB — CBC WITH DIFFERENTIAL/PLATELET
BASOS PCT: 0.8 %
Basophils Absolute: 40 cells/uL (ref 0–200)
Eosinophils Absolute: 140 cells/uL (ref 15–500)
Eosinophils Relative: 2.8 %
HCT: 39.8 % (ref 38.5–50.0)
HEMOGLOBIN: 12.5 g/dL — AB (ref 13.2–17.1)
Lymphs Abs: 1905 cells/uL (ref 850–3900)
MCH: 23.1 pg — AB (ref 27.0–33.0)
MCHC: 31.4 g/dL — ABNORMAL LOW (ref 32.0–36.0)
MCV: 73.7 fL — ABNORMAL LOW (ref 80.0–100.0)
MONOS PCT: 8.9 %
MPV: 11.2 fL (ref 7.5–12.5)
NEUTROS ABS: 2470 {cells}/uL (ref 1500–7800)
Neutrophils Relative %: 49.4 %
PLATELETS: 198 10*3/uL (ref 140–400)
RBC: 5.4 10*6/uL (ref 4.20–5.80)
RDW: 14.7 % (ref 11.0–15.0)
TOTAL LYMPHOCYTE: 38.1 %
WBC mixed population: 445 cells/uL (ref 200–950)
WBC: 5 10*3/uL (ref 3.8–10.8)

## 2017-05-22 LAB — PHOSPHORUS: Phosphorus: 3.2 mg/dL (ref 2.5–4.5)

## 2017-05-22 LAB — HIV ANTIBODY (ROUTINE TESTING W REFLEX): HIV 1&2 Ab, 4th Generation: NONREACTIVE

## 2017-05-22 LAB — CK: Total CK: 212 U/L — ABNORMAL HIGH (ref 44–196)

## 2017-05-22 LAB — LIPASE: LIPASE: 18 U/L (ref 7–60)

## 2017-05-31 ENCOUNTER — Encounter: Payer: BLUE CROSS/BLUE SHIELD | Admitting: *Deleted

## 2017-06-07 ENCOUNTER — Encounter: Payer: BLUE CROSS/BLUE SHIELD | Admitting: *Deleted

## 2017-06-07 ENCOUNTER — Encounter (INDEPENDENT_AMBULATORY_CARE_PROVIDER_SITE_OTHER): Payer: Self-pay | Admitting: *Deleted

## 2017-06-07 VITALS — BP 113/73 | HR 64 | Temp 98.1°F | Wt 157.5 lb

## 2017-06-07 DIAGNOSIS — Z006 Encounter for examination for normal comparison and control in clinical research program: Secondary | ICD-10-CM

## 2017-06-07 LAB — CBC WITH DIFFERENTIAL/PLATELET
BASOS PCT: 0.9 %
Basophils Absolute: 52 cells/uL (ref 0–200)
EOS ABS: 110 {cells}/uL (ref 15–500)
Eosinophils Relative: 1.9 %
HCT: 40.4 % (ref 38.5–50.0)
HEMOGLOBIN: 12.8 g/dL — AB (ref 13.2–17.1)
LYMPHS ABS: 2500 {cells}/uL (ref 850–3900)
MCH: 23.4 pg — ABNORMAL LOW (ref 27.0–33.0)
MCHC: 31.7 g/dL — ABNORMAL LOW (ref 32.0–36.0)
MCV: 73.7 fL — AB (ref 80.0–100.0)
MPV: 10.5 fL (ref 7.5–12.5)
Monocytes Relative: 8 %
NEUTROS ABS: 2674 {cells}/uL (ref 1500–7800)
Neutrophils Relative %: 46.1 %
PLATELETS: 205 10*3/uL (ref 140–400)
RBC: 5.48 10*6/uL (ref 4.20–5.80)
RDW: 15.1 % — ABNORMAL HIGH (ref 11.0–15.0)
TOTAL LYMPHOCYTE: 43.1 %
WBC: 5.8 10*3/uL (ref 3.8–10.8)
WBCMIX: 464 {cells}/uL (ref 200–950)

## 2017-06-07 NOTE — Progress Notes (Signed)
Study: A Phase 2b/3 Double Blind Safety and Efficacy Study of Injectable Cabotegravir compared to Daily Oral Tenofovir Disoproxil Fumarate/Emtricitabine (TDF/FTC), For Pre-Exposure Prophylaxis in HIV-Uninfected Cisgender Men and Transgender Women who have sex with Men.  Medication: Investigational Injectable Cabotegravir/placebo compared to Truvada/placebo. Duration: Around 4 years.  Keith Becker is here for week 115. Mild tenderness after his last injection which he states lasted a day. No new problems or concerns. Rapid HIV non-reactive. Next study visit scheduled for 07/07/17.

## 2017-06-08 LAB — PHOSPHORUS: PHOSPHORUS: 3.8 mg/dL (ref 2.5–4.5)

## 2017-06-08 LAB — COMPREHENSIVE METABOLIC PANEL
AG Ratio: 1.8 (calc) (ref 1.0–2.5)
ALT: 13 U/L (ref 9–46)
AST: 15 U/L (ref 10–40)
Albumin: 4.4 g/dL (ref 3.6–5.1)
Alkaline phosphatase (APISO): 54 U/L (ref 40–115)
BUN: 10 mg/dL (ref 7–25)
CALCIUM: 9.3 mg/dL (ref 8.6–10.3)
CO2: 29 mmol/L (ref 20–32)
CREATININE: 0.88 mg/dL (ref 0.60–1.35)
Chloride: 106 mmol/L (ref 98–110)
GLUCOSE: 110 mg/dL — AB (ref 65–99)
Globulin: 2.5 g/dL (calc) (ref 1.9–3.7)
Potassium: 4.1 mmol/L (ref 3.5–5.3)
SODIUM: 141 mmol/L (ref 135–146)
TOTAL PROTEIN: 6.9 g/dL (ref 6.1–8.1)
Total Bilirubin: 0.4 mg/dL (ref 0.2–1.2)

## 2017-06-08 LAB — LIPASE: Lipase: <5 U/L — ABNORMAL LOW (ref 7–60)

## 2017-06-08 LAB — HIV ANTIBODY (ROUTINE TESTING W REFLEX): HIV: NONREACTIVE

## 2017-06-08 LAB — AMYLASE: Amylase: 77 U/L (ref 21–101)

## 2017-06-08 LAB — CK: CK TOTAL: 246 U/L — AB (ref 44–196)

## 2017-07-07 ENCOUNTER — Encounter (INDEPENDENT_AMBULATORY_CARE_PROVIDER_SITE_OTHER): Payer: BLUE CROSS/BLUE SHIELD

## 2017-07-07 VITALS — BP 101/66 | HR 64 | Temp 98.2°F

## 2017-07-07 DIAGNOSIS — Z006 Encounter for examination for normal comparison and control in clinical research program: Secondary | ICD-10-CM

## 2017-07-07 NOTE — Progress Notes (Signed)
Patient here for injection visit Study [id number] week 121. No complaints of new medical concerns or changes in medications. Patient had injection administered at 1159 in right glute. No concerns voiced, site unremarkable. Patient scheduled for follow-up study visit in two weeks.

## 2017-07-08 LAB — CBC WITH DIFFERENTIAL/PLATELET
BASOS ABS: 33 {cells}/uL (ref 0–200)
BASOS PCT: 0.5 %
EOS ABS: 78 {cells}/uL (ref 15–500)
EOS PCT: 1.2 %
HEMATOCRIT: 39.7 % (ref 38.5–50.0)
Hemoglobin: 12.7 g/dL — ABNORMAL LOW (ref 13.2–17.1)
LYMPHS ABS: 1560 {cells}/uL (ref 850–3900)
MCH: 23.5 pg — AB (ref 27.0–33.0)
MCHC: 32 g/dL (ref 32.0–36.0)
MCV: 73.4 fL — ABNORMAL LOW (ref 80.0–100.0)
MONOS PCT: 7.7 %
MPV: 10.3 fL (ref 7.5–12.5)
NEUTROS ABS: 4329 {cells}/uL (ref 1500–7800)
Neutrophils Relative %: 66.6 %
Platelets: 186 10*3/uL (ref 140–400)
RBC: 5.41 10*6/uL (ref 4.20–5.80)
RDW: 14.7 % (ref 11.0–15.0)
Total Lymphocyte: 24 %
WBC mixed population: 501 cells/uL (ref 200–950)
WBC: 6.5 10*3/uL (ref 3.8–10.8)

## 2017-07-08 LAB — COMPREHENSIVE METABOLIC PANEL
AG RATIO: 1.9 (calc) (ref 1.0–2.5)
ALKALINE PHOSPHATASE (APISO): 51 U/L (ref 40–115)
ALT: 14 U/L (ref 9–46)
AST: 12 U/L (ref 10–40)
Albumin: 4.4 g/dL (ref 3.6–5.1)
BILIRUBIN TOTAL: 0.3 mg/dL (ref 0.2–1.2)
BUN: 11 mg/dL (ref 7–25)
CALCIUM: 9.3 mg/dL (ref 8.6–10.3)
CHLORIDE: 107 mmol/L (ref 98–110)
CO2: 29 mmol/L (ref 20–32)
Creat: 0.87 mg/dL (ref 0.60–1.35)
GLOBULIN: 2.3 g/dL (ref 1.9–3.7)
Glucose, Bld: 82 mg/dL (ref 65–99)
Potassium: 4.4 mmol/L (ref 3.5–5.3)
Sodium: 142 mmol/L (ref 135–146)
Total Protein: 6.7 g/dL (ref 6.1–8.1)

## 2017-07-08 LAB — AMYLASE: AMYLASE: 80 U/L (ref 21–101)

## 2017-07-08 LAB — CK: Total CK: 127 U/L (ref 44–196)

## 2017-07-08 LAB — HIV ANTIBODY (ROUTINE TESTING W REFLEX): HIV 1&2 Ab, 4th Generation: NONREACTIVE

## 2017-07-08 LAB — PHOSPHORUS: Phosphorus: 4.1 mg/dL (ref 2.5–4.5)

## 2017-07-08 LAB — LIPASE: LIPASE: 6 U/L — AB (ref 7–60)

## 2017-08-02 ENCOUNTER — Encounter (INDEPENDENT_AMBULATORY_CARE_PROVIDER_SITE_OTHER): Payer: Self-pay

## 2017-08-02 VITALS — BP 95/60 | HR 67 | Temp 98.1°F | Wt 155.2 lb

## 2017-08-02 DIAGNOSIS — Z006 Encounter for examination for normal comparison and control in clinical research program: Secondary | ICD-10-CM

## 2017-08-02 NOTE — Progress Notes (Signed)
Participant here for study [id number] week 123 safety followup. Vital signs normal. Voices no complaints. Had mild pain at the injection site for one day post. No intervention required and no other issues noted. Has not changed any medications and has not had any side effects or new  medical concerns. Next study appointment scheduled for 7/29.

## 2017-08-03 LAB — CBC WITH DIFFERENTIAL/PLATELET
BASOS ABS: 41 {cells}/uL (ref 0–200)
Basophils Relative: 0.9 %
EOS PCT: 1.3 %
Eosinophils Absolute: 60 cells/uL (ref 15–500)
HEMATOCRIT: 38.7 % (ref 38.5–50.0)
HEMOGLOBIN: 12.2 g/dL — AB (ref 13.2–17.1)
LYMPHS ABS: 1799 {cells}/uL (ref 850–3900)
MCH: 23.2 pg — ABNORMAL LOW (ref 27.0–33.0)
MCHC: 31.5 g/dL — AB (ref 32.0–36.0)
MCV: 73.6 fL — AB (ref 80.0–100.0)
MPV: 10.4 fL (ref 7.5–12.5)
Monocytes Relative: 9 %
NEUTROS ABS: 2286 {cells}/uL (ref 1500–7800)
NEUTROS PCT: 49.7 %
Platelets: 190 10*3/uL (ref 140–400)
RBC: 5.26 10*6/uL (ref 4.20–5.80)
RDW: 15 % (ref 11.0–15.0)
Total Lymphocyte: 39.1 %
WBC: 4.6 10*3/uL (ref 3.8–10.8)
WBCMIX: 414 {cells}/uL (ref 200–950)

## 2017-08-03 LAB — COMPREHENSIVE METABOLIC PANEL
AG Ratio: 1.8 (calc) (ref 1.0–2.5)
ALT: 14 U/L (ref 9–46)
AST: 13 U/L (ref 10–40)
Albumin: 4.4 g/dL (ref 3.6–5.1)
Alkaline phosphatase (APISO): 52 U/L (ref 40–115)
BILIRUBIN TOTAL: 0.5 mg/dL (ref 0.2–1.2)
BUN: 13 mg/dL (ref 7–25)
CO2: 28 mmol/L (ref 20–32)
CREATININE: 0.88 mg/dL (ref 0.60–1.35)
Calcium: 9.5 mg/dL (ref 8.6–10.3)
Chloride: 107 mmol/L (ref 98–110)
GLUCOSE: 92 mg/dL (ref 65–99)
Globulin: 2.5 g/dL (calc) (ref 1.9–3.7)
POTASSIUM: 4.1 mmol/L (ref 3.5–5.3)
SODIUM: 141 mmol/L (ref 135–146)
TOTAL PROTEIN: 6.9 g/dL (ref 6.1–8.1)

## 2017-08-03 LAB — AMYLASE: Amylase: 107 U/L — ABNORMAL HIGH (ref 21–101)

## 2017-08-03 LAB — CK: CK TOTAL: 209 U/L — AB (ref 44–196)

## 2017-08-03 LAB — HIV ANTIBODY (ROUTINE TESTING W REFLEX): HIV 1&2 Ab, 4th Generation: NONREACTIVE

## 2017-08-03 LAB — LIPASE: Lipase: 8 U/L (ref 7–60)

## 2017-08-03 LAB — PHOSPHORUS: PHOSPHORUS: 3.5 mg/dL (ref 2.5–4.5)

## 2017-09-10 ENCOUNTER — Encounter (INDEPENDENT_AMBULATORY_CARE_PROVIDER_SITE_OTHER): Payer: Self-pay | Admitting: *Deleted

## 2017-09-10 VITALS — BP 116/76 | HR 56 | Temp 97.6°F | Wt 151.8 lb

## 2017-09-10 DIAGNOSIS — Z006 Encounter for examination for normal comparison and control in clinical research program: Secondary | ICD-10-CM

## 2017-09-10 NOTE — Progress Notes (Signed)
Keith Becker is here for his week 129 HPTN 083 visit. No new problems or medications. Adherence at 54% with his oral study medication. States that his work scheduled has been extremely hectic and that he has been working long hours over the past several weeks. He knew that he had missed a "few" doses but was surprised to hear that his adherence was so low. We reviewed strategies to help with his adherence. He plans to set an alarm on his phone. I also provided him with a small travel pill box. He understands the importance of taking his medication daily. Denied any acute HIV symptoms. Rapid HIV non-reactive. Cabotegravir/placebo injection given (L) glute without problem. He will return in 2 weeks for his next study visit.

## 2017-09-11 LAB — CBC WITH DIFFERENTIAL/PLATELET
BASOS PCT: 0.4 %
Basophils Absolute: 20 cells/uL (ref 0–200)
EOS ABS: 152 {cells}/uL (ref 15–500)
Eosinophils Relative: 3.1 %
HCT: 40.9 % (ref 38.5–50.0)
HEMOGLOBIN: 12.9 g/dL — AB (ref 13.2–17.1)
Lymphs Abs: 1891 cells/uL (ref 850–3900)
MCH: 22.8 pg — AB (ref 27.0–33.0)
MCHC: 31.5 g/dL — ABNORMAL LOW (ref 32.0–36.0)
MCV: 72.4 fL — ABNORMAL LOW (ref 80.0–100.0)
MONOS PCT: 9 %
MPV: 10.6 fL (ref 7.5–12.5)
NEUTROS ABS: 2396 {cells}/uL (ref 1500–7800)
Neutrophils Relative %: 48.9 %
Platelets: 189 10*3/uL (ref 140–400)
RBC: 5.65 10*6/uL (ref 4.20–5.80)
RDW: 15.2 % — ABNORMAL HIGH (ref 11.0–15.0)
Total Lymphocyte: 38.6 %
WBC mixed population: 441 cells/uL (ref 200–950)
WBC: 4.9 10*3/uL (ref 3.8–10.8)

## 2017-09-11 LAB — COMPREHENSIVE METABOLIC PANEL
AG Ratio: 2.1 (calc) (ref 1.0–2.5)
ALT: 28 U/L (ref 9–46)
AST: 16 U/L (ref 10–40)
Albumin: 4.5 g/dL (ref 3.6–5.1)
Alkaline phosphatase (APISO): 54 U/L (ref 40–115)
BILIRUBIN TOTAL: 0.5 mg/dL (ref 0.2–1.2)
BUN: 13 mg/dL (ref 7–25)
CALCIUM: 9.3 mg/dL (ref 8.6–10.3)
CO2: 29 mmol/L (ref 20–32)
CREATININE: 0.89 mg/dL (ref 0.60–1.35)
Chloride: 106 mmol/L (ref 98–110)
Globulin: 2.1 g/dL (calc) (ref 1.9–3.7)
Glucose, Bld: 120 mg/dL — ABNORMAL HIGH (ref 65–99)
POTASSIUM: 4.2 mmol/L (ref 3.5–5.3)
Sodium: 141 mmol/L (ref 135–146)
Total Protein: 6.6 g/dL (ref 6.1–8.1)

## 2017-09-11 LAB — PHOSPHORUS: Phosphorus: 3.8 mg/dL (ref 2.5–4.5)

## 2017-09-11 LAB — LIPASE: Lipase: 52 U/L (ref 7–60)

## 2017-09-11 LAB — HIV ANTIBODY (ROUTINE TESTING W REFLEX): HIV: NONREACTIVE

## 2017-09-11 LAB — AMYLASE: Amylase: 121 U/L — ABNORMAL HIGH (ref 21–101)

## 2017-09-11 LAB — CK: CK TOTAL: 175 U/L (ref 44–196)

## 2017-09-23 ENCOUNTER — Encounter (INDEPENDENT_AMBULATORY_CARE_PROVIDER_SITE_OTHER): Payer: Self-pay | Admitting: *Deleted

## 2017-09-23 VITALS — BP 115/72 | HR 54 | Temp 98.1°F | Wt 150.5 lb

## 2017-09-23 DIAGNOSIS — Z006 Encounter for examination for normal comparison and control in clinical research program: Secondary | ICD-10-CM

## 2017-09-23 NOTE — Progress Notes (Signed)
Keith Becker is here for his week 131 visit. Verbalized mild tenderness along with a small knot at injection site which lasted a day. No other concerns and no new medications. Feels that his adherence is better with his oral study medication. Using his pill box and phone alarm to assist as reminder. Rapid HIV non-reactive. He will return in September for his next study visit.

## 2017-09-24 LAB — CBC WITH DIFFERENTIAL/PLATELET
BASOS PCT: 0.8 %
Basophils Absolute: 40 cells/uL (ref 0–200)
EOS PCT: 3.2 %
Eosinophils Absolute: 160 cells/uL (ref 15–500)
HCT: 41.4 % (ref 38.5–50.0)
HEMOGLOBIN: 12.9 g/dL — AB (ref 13.2–17.1)
Lymphs Abs: 1775 cells/uL (ref 850–3900)
MCH: 23.2 pg — ABNORMAL LOW (ref 27.0–33.0)
MCHC: 31.2 g/dL — ABNORMAL LOW (ref 32.0–36.0)
MCV: 74.5 fL — AB (ref 80.0–100.0)
MPV: 10.3 fL (ref 7.5–12.5)
Monocytes Relative: 9.8 %
NEUTROS ABS: 2535 {cells}/uL (ref 1500–7800)
Neutrophils Relative %: 50.7 %
Platelets: 207 10*3/uL (ref 140–400)
RBC: 5.56 10*6/uL (ref 4.20–5.80)
RDW: 14.7 % (ref 11.0–15.0)
Total Lymphocyte: 35.5 %
WBC: 5 10*3/uL (ref 3.8–10.8)
WBCMIX: 490 {cells}/uL (ref 200–950)

## 2017-09-24 LAB — RPR: RPR Ser Ql: NONREACTIVE

## 2017-09-24 LAB — COMPREHENSIVE METABOLIC PANEL
AG RATIO: 1.7 (calc) (ref 1.0–2.5)
ALKALINE PHOSPHATASE (APISO): 53 U/L (ref 40–115)
ALT: 14 U/L (ref 9–46)
AST: 12 U/L (ref 10–40)
Albumin: 4.3 g/dL (ref 3.6–5.1)
BILIRUBIN TOTAL: 0.5 mg/dL (ref 0.2–1.2)
BUN: 12 mg/dL (ref 7–25)
CALCIUM: 9.2 mg/dL (ref 8.6–10.3)
CO2: 27 mmol/L (ref 20–32)
Chloride: 107 mmol/L (ref 98–110)
Creat: 0.92 mg/dL (ref 0.60–1.35)
Globulin: 2.5 g/dL (calc) (ref 1.9–3.7)
Glucose, Bld: 111 mg/dL — ABNORMAL HIGH (ref 65–99)
Potassium: 4.3 mmol/L (ref 3.5–5.3)
SODIUM: 142 mmol/L (ref 135–146)
TOTAL PROTEIN: 6.8 g/dL (ref 6.1–8.1)

## 2017-09-24 LAB — CK: Total CK: 201 U/L — ABNORMAL HIGH (ref 44–196)

## 2017-09-24 LAB — C. TRACHOMATIS/N. GONORRHOEAE RNA
C. TRACHOMATIS RNA, TMA: NOT DETECTED
N. GONORRHOEAE RNA, TMA: NOT DETECTED

## 2017-09-24 LAB — LIPASE: Lipase: 32 U/L (ref 7–60)

## 2017-09-24 LAB — HIV ANTIBODY (ROUTINE TESTING W REFLEX): HIV 1&2 Ab, 4th Generation: NONREACTIVE

## 2017-09-24 LAB — AMYLASE: AMYLASE: 96 U/L (ref 21–101)

## 2017-09-24 LAB — PHOSPHORUS: Phosphorus: 3.5 mg/dL (ref 2.5–4.5)

## 2017-09-26 LAB — CT/NG RNA, TMA RECTAL
CHLAMYDIA TRACHOMATIS RNA: NOT DETECTED
NEISSERIA GONORRHOEAE RNA: NOT DETECTED

## 2017-11-04 ENCOUNTER — Encounter (INDEPENDENT_AMBULATORY_CARE_PROVIDER_SITE_OTHER): Payer: Self-pay

## 2017-11-04 VITALS — BP 110/70 | HR 60 | Temp 98.3°F | Wt 145.5 lb

## 2017-11-04 DIAGNOSIS — Z006 Encounter for examination for normal comparison and control in clinical research program: Secondary | ICD-10-CM

## 2017-11-04 NOTE — Research (Signed)
Participant here for [id number] study visit. No new concerns or complaints voiced. Participant's compliance with oral IP is at 86%, significantly improved from last check in. He was reminded of importance of taking daily and voices understanding. Rapid HIV non-reactive. Received new dispense of oral IP and IM injection of cabotegravir/placebo in left glute without issue. He is scheduled for follow up on 10/3.

## 2017-11-05 LAB — CBC WITH DIFFERENTIAL/PLATELET
BASOS ABS: 31 {cells}/uL (ref 0–200)
BASOS PCT: 0.7 %
EOS ABS: 101 {cells}/uL (ref 15–500)
Eosinophils Relative: 2.3 %
HCT: 39.7 % (ref 38.5–50.0)
HEMOGLOBIN: 12.6 g/dL — AB (ref 13.2–17.1)
Lymphs Abs: 933 cells/uL (ref 850–3900)
MCH: 23.6 pg — AB (ref 27.0–33.0)
MCHC: 31.7 g/dL — AB (ref 32.0–36.0)
MCV: 74.3 fL — AB (ref 80.0–100.0)
MONOS PCT: 9 %
MPV: 10.8 fL (ref 7.5–12.5)
Neutro Abs: 2939 cells/uL (ref 1500–7800)
Neutrophils Relative %: 66.8 %
Platelets: 189 10*3/uL (ref 140–400)
RBC: 5.34 10*6/uL (ref 4.20–5.80)
RDW: 15 % (ref 11.0–15.0)
Total Lymphocyte: 21.2 %
WBC: 4.4 10*3/uL (ref 3.8–10.8)
WBCMIX: 396 {cells}/uL (ref 200–950)

## 2017-11-05 LAB — COMPREHENSIVE METABOLIC PANEL
AG RATIO: 2.3 (calc) (ref 1.0–2.5)
ALKALINE PHOSPHATASE (APISO): 47 U/L (ref 40–115)
ALT: 13 U/L (ref 9–46)
AST: 12 U/L (ref 10–40)
Albumin: 4.6 g/dL (ref 3.6–5.1)
BILIRUBIN TOTAL: 0.4 mg/dL (ref 0.2–1.2)
BUN: 9 mg/dL (ref 7–25)
CALCIUM: 9.3 mg/dL (ref 8.6–10.3)
CHLORIDE: 105 mmol/L (ref 98–110)
CO2: 30 mmol/L (ref 20–32)
Creat: 0.9 mg/dL (ref 0.60–1.35)
GLOBULIN: 2 g/dL (ref 1.9–3.7)
GLUCOSE: 124 mg/dL — AB (ref 65–99)
Potassium: 4.1 mmol/L (ref 3.5–5.3)
Sodium: 139 mmol/L (ref 135–146)
Total Protein: 6.6 g/dL (ref 6.1–8.1)

## 2017-11-05 LAB — HIV ANTIBODY (ROUTINE TESTING W REFLEX): HIV 1&2 Ab, 4th Generation: NONREACTIVE

## 2017-11-05 LAB — CK: CK TOTAL: 129 U/L (ref 44–196)

## 2017-11-05 LAB — LIPASE: LIPASE: 13 U/L (ref 7–60)

## 2017-11-05 LAB — PHOSPHORUS: Phosphorus: 3.4 mg/dL (ref 2.5–4.5)

## 2017-11-05 LAB — AMYLASE: Amylase: 89 U/L (ref 21–101)

## 2017-11-11 ENCOUNTER — Encounter (INDEPENDENT_AMBULATORY_CARE_PROVIDER_SITE_OTHER): Payer: BLUE CROSS/BLUE SHIELD | Admitting: *Deleted

## 2017-11-11 VITALS — BP 125/64 | HR 57 | Temp 97.7°F | Wt 143.5 lb

## 2017-11-11 DIAGNOSIS — Z006 Encounter for examination for normal comparison and control in clinical research program: Secondary | ICD-10-CM

## 2017-11-11 NOTE — Research (Signed)
Keith Becker is here for his followup visit for the HPTN injection last week. He said he had a little bit of soreness the next day after the injection. He says he has missed 1 dose since he got his meds last week. He will be returning in November for the next visit.

## 2017-11-12 LAB — CBC WITH DIFFERENTIAL/PLATELET
BASOS ABS: 39 {cells}/uL (ref 0–200)
Basophils Relative: 0.9 %
EOS ABS: 172 {cells}/uL (ref 15–500)
EOS PCT: 4 %
HEMATOCRIT: 39.4 % (ref 38.5–50.0)
Hemoglobin: 12.3 g/dL — ABNORMAL LOW (ref 13.2–17.1)
LYMPHS ABS: 2064 {cells}/uL (ref 850–3900)
MCH: 23.3 pg — AB (ref 27.0–33.0)
MCHC: 31.2 g/dL — ABNORMAL LOW (ref 32.0–36.0)
MCV: 74.5 fL — ABNORMAL LOW (ref 80.0–100.0)
MONOS PCT: 9.6 %
MPV: 11.2 fL (ref 7.5–12.5)
NEUTROS PCT: 37.5 %
Neutro Abs: 1613 cells/uL (ref 1500–7800)
Platelets: 216 10*3/uL (ref 140–400)
RBC: 5.29 10*6/uL (ref 4.20–5.80)
RDW: 14.9 % (ref 11.0–15.0)
Total Lymphocyte: 48 %
WBC mixed population: 413 cells/uL (ref 200–950)
WBC: 4.3 10*3/uL (ref 3.8–10.8)

## 2017-11-12 LAB — CK: CK TOTAL: 179 U/L (ref 44–196)

## 2017-11-12 LAB — LIPASE: LIPASE: 18 U/L (ref 7–60)

## 2017-11-12 LAB — AMYLASE: Amylase: 84 U/L (ref 21–101)

## 2017-11-12 LAB — COMPREHENSIVE METABOLIC PANEL
AG Ratio: 1.8 (calc) (ref 1.0–2.5)
ALBUMIN MSPROF: 4.3 g/dL (ref 3.6–5.1)
ALT: 12 U/L (ref 9–46)
AST: 13 U/L (ref 10–40)
Alkaline phosphatase (APISO): 51 U/L (ref 40–115)
BILIRUBIN TOTAL: 0.5 mg/dL (ref 0.2–1.2)
BUN: 12 mg/dL (ref 7–25)
CHLORIDE: 106 mmol/L (ref 98–110)
CO2: 27 mmol/L (ref 20–32)
CREATININE: 0.82 mg/dL (ref 0.60–1.35)
Calcium: 9.3 mg/dL (ref 8.6–10.3)
GLOBULIN: 2.4 g/dL (ref 1.9–3.7)
Glucose, Bld: 109 mg/dL — ABNORMAL HIGH (ref 65–99)
POTASSIUM: 4 mmol/L (ref 3.5–5.3)
SODIUM: 141 mmol/L (ref 135–146)
TOTAL PROTEIN: 6.7 g/dL (ref 6.1–8.1)

## 2017-11-12 LAB — PHOSPHORUS: Phosphorus: 3.8 mg/dL (ref 2.5–4.5)

## 2017-11-12 LAB — HIV ANTIBODY (ROUTINE TESTING W REFLEX): HIV 1&2 Ab, 4th Generation: NONREACTIVE

## 2017-12-30 ENCOUNTER — Encounter (INDEPENDENT_AMBULATORY_CARE_PROVIDER_SITE_OTHER): Payer: Self-pay

## 2017-12-30 VITALS — BP 118/81 | HR 73 | Temp 98.4°F | Wt 139.8 lb

## 2017-12-30 DIAGNOSIS — Z006 Encounter for examination for normal comparison and control in clinical research program: Secondary | ICD-10-CM

## 2017-12-30 NOTE — Research (Signed)
Participant here for [id number] injection visit. Patient had a low grade fever from 10/7-10/14 followed by diarrhea from 10/14-10/21. He describes the diarrhea as about 5-6 episodes of watery stool per day. These symptoms have since resolved. He has recently experienced a break up and is feeling down. We discussed our counseling services and he has established an appointment with Rene Kocheregina at the same time as his follow up for the research study. Today's oral IP compliance was 81%, he does not think he missed these consecutively but rather a day a week. We discussed improving and he says that he uses a cell phone alarm. Today I suggested snoozing the alarm until he actually took the medication so that he would not turn it off and then forget. He stated he would try this method. Today's rapid HIV was non-reactive. He received a new dispense of oral IP and IM injection of cabotegravir/placebo in the right glute. He is scheduled for follow up on 12/5.

## 2017-12-31 LAB — COMPREHENSIVE METABOLIC PANEL
AG RATIO: 1.7 (calc) (ref 1.0–2.5)
ALBUMIN MSPROF: 4.4 g/dL (ref 3.6–5.1)
ALT: 14 U/L (ref 9–46)
AST: 14 U/L (ref 10–40)
Alkaline phosphatase (APISO): 48 U/L (ref 40–115)
BILIRUBIN TOTAL: 0.9 mg/dL (ref 0.2–1.2)
BUN: 11 mg/dL (ref 7–25)
CALCIUM: 9.7 mg/dL (ref 8.6–10.3)
CHLORIDE: 105 mmol/L (ref 98–110)
CO2: 29 mmol/L (ref 20–32)
Creat: 0.94 mg/dL (ref 0.60–1.35)
GLOBULIN: 2.6 g/dL (ref 1.9–3.7)
GLUCOSE: 81 mg/dL (ref 65–99)
POTASSIUM: 3.9 mmol/L (ref 3.5–5.3)
SODIUM: 140 mmol/L (ref 135–146)
TOTAL PROTEIN: 7 g/dL (ref 6.1–8.1)

## 2017-12-31 LAB — CBC WITH DIFFERENTIAL/PLATELET
BASOS PCT: 1.2 %
Basophils Absolute: 52 cells/uL (ref 0–200)
EOS ABS: 82 {cells}/uL (ref 15–500)
EOS PCT: 1.9 %
HCT: 41.7 % (ref 38.5–50.0)
HEMOGLOBIN: 13.3 g/dL (ref 13.2–17.1)
Lymphs Abs: 1965 cells/uL (ref 850–3900)
MCH: 23.5 pg — AB (ref 27.0–33.0)
MCHC: 31.9 g/dL — ABNORMAL LOW (ref 32.0–36.0)
MCV: 73.5 fL — ABNORMAL LOW (ref 80.0–100.0)
MONOS PCT: 12.4 %
MPV: 10.3 fL (ref 7.5–12.5)
NEUTROS ABS: 1668 {cells}/uL (ref 1500–7800)
Neutrophils Relative %: 38.8 %
PLATELETS: 209 10*3/uL (ref 140–400)
RBC: 5.67 10*6/uL (ref 4.20–5.80)
RDW: 14.5 % (ref 11.0–15.0)
TOTAL LYMPHOCYTE: 45.7 %
WBC mixed population: 533 cells/uL (ref 200–950)
WBC: 4.3 10*3/uL (ref 3.8–10.8)

## 2017-12-31 LAB — HIV ANTIBODY (ROUTINE TESTING W REFLEX): HIV 1&2 Ab, 4th Generation: NONREACTIVE

## 2017-12-31 LAB — AMYLASE: Amylase: 78 U/L (ref 21–101)

## 2017-12-31 LAB — PHOSPHORUS: Phosphorus: 3.8 mg/dL (ref 2.5–4.5)

## 2017-12-31 LAB — CK: Total CK: 153 U/L (ref 44–196)

## 2017-12-31 LAB — LIPASE: LIPASE: 9 U/L (ref 7–60)

## 2018-01-12 ENCOUNTER — Ambulatory Visit (INDEPENDENT_AMBULATORY_CARE_PROVIDER_SITE_OTHER): Payer: BLUE CROSS/BLUE SHIELD | Admitting: Licensed Clinical Social Worker

## 2018-01-12 ENCOUNTER — Encounter (INDEPENDENT_AMBULATORY_CARE_PROVIDER_SITE_OTHER): Payer: BLUE CROSS/BLUE SHIELD

## 2018-01-12 VITALS — BP 118/81 | HR 73 | Temp 98.4°F | Wt 139.8 lb

## 2018-01-12 DIAGNOSIS — Z006 Encounter for examination for normal comparison and control in clinical research program: Secondary | ICD-10-CM

## 2018-01-12 DIAGNOSIS — F4321 Adjustment disorder with depressed mood: Secondary | ICD-10-CM | POA: Diagnosis not present

## 2018-01-13 ENCOUNTER — Encounter: Payer: BLUE CROSS/BLUE SHIELD | Admitting: *Deleted

## 2018-01-13 ENCOUNTER — Ambulatory Visit: Payer: BLUE CROSS/BLUE SHIELD | Admitting: Licensed Clinical Social Worker

## 2018-01-13 LAB — HIV ANTIBODY (ROUTINE TESTING W REFLEX): HIV 1&2 Ab, 4th Generation: NONREACTIVE

## 2018-01-13 LAB — COMPREHENSIVE METABOLIC PANEL
AG RATIO: 2 (calc) (ref 1.0–2.5)
ALKALINE PHOSPHATASE (APISO): 50 U/L (ref 40–115)
ALT: 17 U/L (ref 9–46)
AST: 14 U/L (ref 10–40)
Albumin: 4.6 g/dL (ref 3.6–5.1)
BILIRUBIN TOTAL: 0.6 mg/dL (ref 0.2–1.2)
BUN: 13 mg/dL (ref 7–25)
CALCIUM: 9.6 mg/dL (ref 8.6–10.3)
CHLORIDE: 106 mmol/L (ref 98–110)
CO2: 27 mmol/L (ref 20–32)
Creat: 0.81 mg/dL (ref 0.60–1.35)
GLOBULIN: 2.3 g/dL (ref 1.9–3.7)
Glucose, Bld: 100 mg/dL — ABNORMAL HIGH (ref 65–99)
Potassium: 3.9 mmol/L (ref 3.5–5.3)
Sodium: 141 mmol/L (ref 135–146)
Total Protein: 6.9 g/dL (ref 6.1–8.1)

## 2018-01-13 LAB — CBC WITH DIFFERENTIAL/PLATELET
Basophils Absolute: 40 cells/uL (ref 0–200)
Basophils Relative: 0.9 %
EOS PCT: 0.7 %
Eosinophils Absolute: 31 cells/uL (ref 15–500)
HEMATOCRIT: 42.3 % (ref 38.5–50.0)
Hemoglobin: 13.4 g/dL (ref 13.2–17.1)
Lymphs Abs: 1716 cells/uL (ref 850–3900)
MCH: 23.3 pg — ABNORMAL LOW (ref 27.0–33.0)
MCHC: 31.7 g/dL — AB (ref 32.0–36.0)
MCV: 73.4 fL — AB (ref 80.0–100.0)
MPV: 11.3 fL (ref 7.5–12.5)
Monocytes Relative: 7.7 %
Neutro Abs: 2275 cells/uL (ref 1500–7800)
Neutrophils Relative %: 51.7 %
Platelets: 210 10*3/uL (ref 140–400)
RBC: 5.76 10*6/uL (ref 4.20–5.80)
RDW: 15 % (ref 11.0–15.0)
TOTAL LYMPHOCYTE: 39 %
WBC: 4.4 10*3/uL (ref 3.8–10.8)
WBCMIX: 339 {cells}/uL (ref 200–950)

## 2018-01-13 LAB — AMYLASE: Amylase: 87 U/L (ref 21–101)

## 2018-01-13 LAB — CK: Total CK: 218 U/L — ABNORMAL HIGH (ref 44–196)

## 2018-01-13 LAB — PHOSPHORUS: PHOSPHORUS: 3.6 mg/dL (ref 2.5–4.5)

## 2018-01-13 LAB — LIPASE: Lipase: <5 U/L — ABNORMAL LOW (ref 7–60)

## 2018-01-13 NOTE — Research (Signed)
Participant here for [id number] follow up visit for week 147. He is aware that the next visit will likely transition to open label truvada as the protocol has been amended. Today's rapid HIV was non-reactive. No new complaints were voiced. Patient has appointment today with counselor, Rene Kocheregina. Next visit scheduled on 1/8.

## 2018-01-13 NOTE — Progress Notes (Signed)
Integrated Behavioral Health Initial Visit  MRN: [id number] Name: Keith Becker  Number of Integrated Behavioral Health Clinician visits:: 1/6 Session Start time: 3:05pm Session End time: 3:38pm Total time: 30 minutes  Type of Service: Integrated Behavioral Health- Individual/Family Interpretor:No. Interpretor Name and Language: n/a   Warm Hand Off Completed.       SUBJECTIVE: Keith CoonDaronte Forinash is a 32 y.o. male accompanied by self Patient was referred by Research for depressive symptoms. Patient reports the following symptoms/concerns: fatigue, general dissatisfaction with life, tendency to isolate Duration of problem: unknown; Severity of problem: mild  OBJECTIVE: Mood: Depressed and Affect: Constricted Risk of harm to self or others: No plan to harm self or others  LIFE CONTEXT: Patient reports that work is his main stressor, and that it seems to be taking over and affecting everything else. He lives with his mother, and has a good relationship with his father and stepmother who live in town. Patient's brother and sister in law live in [place] and are supportive as well. 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 He does not have financial concerns and reports that he has supportive friends and a church family, though he has not gotten to spend any time with them lately due to working as much.   GOALS ADDRESSED: Patient will: 1. Demonstrate ability to: Increase healthy adjustment to current life circumstances  INTERVENTIONS: Interventions utilized: Motivational Interviewing and Supportive Counseling   ASSESSMENT: Patient currently experiencing a general dissatisfaction with his life circumstances, some episodes of sad/depressed mood, stress over work situation, and fatigue most days. He reports no history of previous mental health concerns or counseling, and no suicidal or homicidal thoughts. The diagnosis most  consistent with his symptoms at this time is Adjustment Disorder with Depressed Mood.   Patient reports that since moving to work at this current [employer] store, he has been working 6 or 7 days a week, and is often called in on his days off an pressured to report. He states that the store is a difficult one in a neighborhood where there tends to be a lot of theft, so more is required of him than at other stores where he has worked in the past. Counselor guided patient to discuss the impact this has had on him. Patient indicates that he is always exhausted, and that he has been isolating himself from others because even on days where he should be able to spend time with friends or do activities he is often called on and guilted into going to work. At this point, he does not even want to see or do anything except sleep when he is not at work. This is a departure, he states, from his past work with the company when he enjoyed and even looked forward to going to work. Patient shares that he just wants a "better, less stressful" life. Counselor challenged patient to identify what a "better, less stressful" life would look like for him. He verbalized wanting a different career, where he can have time to himself and feel valued for what he does. Patient also indicated that he wants to build his own life and spend time nurturing relationships with his friends and family, maybe start a family of his own. In his mind, work keeps him from beginning any of these things because of the expectations placed on him. Counselor provided patient with education and a handout about balancing dimensions of wellness. Patient  and counselor explored ways that patient can give even little amounts of energy to the values he has that are outside of work. Counselor encouraged patient to set appropriate limits and boundaries at work, and practiced with him ways to do so in a way that is not disrespectful or insubordinate.    Patient may  benefit from supportive and solution-focused therapy sessions as needed.   PLAN: 1. Patient will call to schedule follow-up visits  Angus Palms, LCSW

## 2018-02-16 ENCOUNTER — Encounter (INDEPENDENT_AMBULATORY_CARE_PROVIDER_SITE_OTHER): Payer: BLUE CROSS/BLUE SHIELD

## 2018-02-16 VITALS — BP 124/73 | HR 67 | Temp 98.3°F | Wt 141.0 lb

## 2018-02-16 DIAGNOSIS — Z006 Encounter for examination for normal comparison and control in clinical research program: Secondary | ICD-10-CM

## 2018-02-17 LAB — COMPREHENSIVE METABOLIC PANEL
AG Ratio: 1.7 (calc) (ref 1.0–2.5)
ALT: 16 U/L (ref 9–46)
AST: 13 U/L (ref 10–40)
Albumin: 4.4 g/dL (ref 3.6–5.1)
Alkaline phosphatase (APISO): 50 U/L (ref 40–115)
BUN: 14 mg/dL (ref 7–25)
CO2: 25 mmol/L (ref 20–32)
CREATININE: 0.85 mg/dL (ref 0.60–1.35)
Calcium: 9.5 mg/dL (ref 8.6–10.3)
Chloride: 106 mmol/L (ref 98–110)
Globulin: 2.6 g/dL (calc) (ref 1.9–3.7)
Glucose, Bld: 102 mg/dL — ABNORMAL HIGH (ref 65–99)
Potassium: 4.4 mmol/L (ref 3.5–5.3)
Sodium: 141 mmol/L (ref 135–146)
TOTAL PROTEIN: 7 g/dL (ref 6.1–8.1)
Total Bilirubin: 0.6 mg/dL (ref 0.2–1.2)

## 2018-02-17 LAB — CBC WITH DIFFERENTIAL/PLATELET
Absolute Monocytes: 405 cells/uL (ref 200–950)
BASOS PCT: 0.7 %
Basophils Absolute: 31 cells/uL (ref 0–200)
Eosinophils Absolute: 62 cells/uL (ref 15–500)
Eosinophils Relative: 1.4 %
HCT: 42.4 % (ref 38.5–50.0)
Hemoglobin: 13.3 g/dL (ref 13.2–17.1)
Lymphs Abs: 1716 cells/uL (ref 850–3900)
MCH: 23.6 pg — ABNORMAL LOW (ref 27.0–33.0)
MCHC: 31.4 g/dL — ABNORMAL LOW (ref 32.0–36.0)
MCV: 75.2 fL — ABNORMAL LOW (ref 80.0–100.0)
MPV: 10.3 fL (ref 7.5–12.5)
Monocytes Relative: 9.2 %
Neutro Abs: 2187 cells/uL (ref 1500–7800)
Neutrophils Relative %: 49.7 %
Platelets: 195 10*3/uL (ref 140–400)
RBC: 5.64 10*6/uL (ref 4.20–5.80)
RDW: 14.9 % (ref 11.0–15.0)
Total Lymphocyte: 39 %
WBC: 4.4 10*3/uL (ref 3.8–10.8)

## 2018-02-17 LAB — AMYLASE: Amylase: 102 U/L — ABNORMAL HIGH (ref 21–101)

## 2018-02-17 LAB — RPR: RPR Ser Ql: NONREACTIVE

## 2018-02-17 LAB — HEPATITIS C ANTIBODY
Hepatitis C Ab: NONREACTIVE
SIGNAL TO CUT-OFF: 0.03 (ref <1.00)

## 2018-02-17 LAB — URINALYSIS
Bilirubin Urine: NEGATIVE
Glucose, UA: NEGATIVE
Hgb urine dipstick: NEGATIVE
Ketones, ur: NEGATIVE
Nitrite: NEGATIVE
Protein, ur: NEGATIVE
Specific Gravity, Urine: 1.022 (ref 1.001–1.03)
pH: 7 (ref 5.0–8.0)

## 2018-02-17 LAB — C. TRACHOMATIS/N. GONORRHOEAE RNA
C. trachomatis RNA, TMA: NOT DETECTED
N. gonorrhoeae RNA, TMA: NOT DETECTED

## 2018-02-17 LAB — CK: Total CK: 151 U/L (ref 44–196)

## 2018-02-17 LAB — HIV ANTIBODY (ROUTINE TESTING W REFLEX): HIV 1&2 Ab, 4th Generation: NONREACTIVE

## 2018-02-17 LAB — PHOSPHORUS: Phosphorus: 3.9 mg/dL (ref 2.5–4.5)

## 2018-02-17 LAB — LIPASE: Lipase: 13 U/L (ref 7–60)

## 2018-02-17 NOTE — Research (Signed)
Participant here for [id number] Week 153. Patient is transitioning off step 2 onto step 3 as the protocol was amended. Participant will be receiving open label Truvada. He states that he is doing well. He has been more adherent with study medication and is now at 93.7% per the IP calculation. Today's rapid HIV non-reactive. He was provided 90 days of truvada. He is scheduled for follow up in 12 weeks on 05/12/2018.

## 2018-02-18 LAB — CT/NG RNA, TMA RECTAL
CHLAMYDIA TRACHOMATIS RNA: NOT DETECTED
Neisseria Gonorrhoeae RNA: NOT DETECTED

## 2018-04-28 ENCOUNTER — Encounter (INDEPENDENT_AMBULATORY_CARE_PROVIDER_SITE_OTHER): Payer: Self-pay | Admitting: *Deleted

## 2018-04-28 ENCOUNTER — Other Ambulatory Visit: Payer: Self-pay

## 2018-04-28 VITALS — BP 105/63 | HR 68 | Temp 98.0°F | Wt 140.0 lb

## 2018-04-28 DIAGNOSIS — Z006 Encounter for examination for normal comparison and control in clinical research program: Secondary | ICD-10-CM

## 2018-04-28 NOTE — Research (Signed)
Keith Becker is here for his Step 3 week 12 HPTN 083 visit. States that he has changed positions at his job and is now work 3rd shift. He says work has been very busy lately. No new complaints or medications. Adherence with oral study medication was 56% by pill count. He knew that he had missed a "few" pills but was surprised at his adherence. I gave him a pill box to help with daily dosing and encouraged him to set his phone alarm to help provide a reminder as well. He feels that this change in his work schedule has contributed to his poor adherence. We discussed the importance of using other forms of protection (i.e condoms). Rapid HIV non-reactive. His next visit is scheduled for 08/04/18.

## 2018-04-29 LAB — HIV ANTIBODY (ROUTINE TESTING W REFLEX): HIV 1&2 Ab, 4th Generation: NONREACTIVE

## 2018-08-03 ENCOUNTER — Encounter: Payer: Self-pay | Admitting: *Deleted

## 2018-08-03 ENCOUNTER — Other Ambulatory Visit: Payer: Self-pay

## 2018-08-03 VITALS — BP 129/85 | HR 53 | Temp 98.3°F | Wt 135.5 lb

## 2018-08-03 DIAGNOSIS — Z006 Encounter for examination for normal comparison and control in clinical research program: Secondary | ICD-10-CM

## 2018-08-03 MED ORDER — STUDY - HPTN 083 (STEP 3, OPEN-LABEL) - EMTRICITABINE/TENOFOVIR DISOPROXIL FUMARATE (TRUVADA) 200-300MG TABLET (PI-VAN DAM): 1.0000 | ORAL_TABLET | Freq: Every day | ORAL | Status: DC

## 2018-08-03 MED ORDER — VALACYCLOVIR HCL 500 MG PO TABS: 500.0000 mg | ORAL_TABLET | Freq: Two times a day (BID) | ORAL | 6 refills | Status: DC

## 2018-08-03 NOTE — Research (Signed)
Keith Becker is here for his step 3 week 24 HPTN 083 visit. States that work has been extremely busy. He has been working long hours. Noted that his weight is down 22 lbs from his baseline. He states that he does not have much of an appetite. Contributes this to his work schedule. We discussed eating small frequent meals and adding carnation instant breakfast to meals. He requested refill on his Valtrex for his occasional herpes outbreaks. Per Dr. Tommy Medal called in valtrex 500mg  BID for 7 days during outbreaks. No new complaints or medications. Rapid HIV non-reactive. Adherence with oral study medication was 86% by pill count. He knew he had missed some due to his work schedule. We again discussed using pill box and setting phone alarm to help assist with dosing. He will return in September for his next study visit.

## 2018-08-04 ENCOUNTER — Encounter: Payer: BLUE CROSS/BLUE SHIELD | Admitting: *Deleted

## 2018-08-04 LAB — COMPREHENSIVE METABOLIC PANEL
AG Ratio: 2.2 (calc) (ref 1.0–2.5)
ALT: 17 U/L (ref 9–46)
AST: 15 U/L (ref 10–40)
Albumin: 4.8 g/dL (ref 3.6–5.1)
Alkaline phosphatase (APISO): 47 U/L (ref 36–130)
BUN: 12 mg/dL (ref 7–25)
CO2: 28 mmol/L (ref 20–32)
Calcium: 9.9 mg/dL (ref 8.6–10.3)
Chloride: 104 mmol/L (ref 98–110)
Creat: 0.94 mg/dL (ref 0.60–1.35)
Globulin: 2.2 g/dL (calc) (ref 1.9–3.7)
Glucose, Bld: 111 mg/dL — ABNORMAL HIGH (ref 65–99)
Potassium: 4 mmol/L (ref 3.5–5.3)
Sodium: 141 mmol/L (ref 135–146)
Total Bilirubin: 0.9 mg/dL (ref 0.2–1.2)
Total Protein: 7 g/dL (ref 6.1–8.1)

## 2018-08-04 LAB — C. TRACHOMATIS/N. GONORRHOEAE RNA
C. trachomatis RNA, TMA: NOT DETECTED
N. gonorrhoeae RNA, TMA: NOT DETECTED

## 2018-08-04 LAB — RPR: RPR Ser Ql: NONREACTIVE

## 2018-08-04 LAB — LIPASE: Lipase: 5 U/L — ABNORMAL LOW (ref 7–60)

## 2018-08-04 LAB — HIV ANTIBODY (ROUTINE TESTING W REFLEX): HIV 1&2 Ab, 4th Generation: NONREACTIVE

## 2018-08-04 LAB — AMYLASE: Amylase: 74 U/L (ref 21–101)

## 2018-08-04 LAB — CK: Total CK: 145 U/L (ref 44–196)

## 2018-08-04 LAB — PHOSPHORUS: Phosphorus: 3.6 mg/dL (ref 2.5–4.5)

## 2018-08-05 LAB — CT/NG RNA, TMA RECTAL
Chlamydia Trachomatis RNA: NOT DETECTED
Neisseria Gonorrhoeae RNA: NOT DETECTED

## 2018-10-26 ENCOUNTER — Encounter (INDEPENDENT_AMBULATORY_CARE_PROVIDER_SITE_OTHER): Payer: Self-pay

## 2018-10-26 ENCOUNTER — Other Ambulatory Visit: Payer: Self-pay

## 2018-10-26 VITALS — BP 109/68 | HR 79 | Temp 98.6°F

## 2018-10-26 DIAGNOSIS — Z006 Encounter for examination for normal comparison and control in clinical research program: Secondary | ICD-10-CM

## 2018-10-27 LAB — HIV ANTIBODY (ROUTINE TESTING W REFLEX): HIV 1&2 Ab, 4th Generation: NONREACTIVE

## 2018-10-27 NOTE — Research (Signed)
Participant here for study [id number] visit. Today's rapid HIV non-reactive. He received 120 days of truvada. Per his product count he was 87% compliant with dosing. He has no new concerns or complaints. He is scheduled for a visit in December for his last study visit as well as transition to clinic prep.

## 2019-01-17 ENCOUNTER — Encounter (INDEPENDENT_AMBULATORY_CARE_PROVIDER_SITE_OTHER): Payer: Self-pay | Admitting: *Deleted

## 2019-01-17 ENCOUNTER — Other Ambulatory Visit: Payer: Self-pay

## 2019-01-17 ENCOUNTER — Ambulatory Visit (INDEPENDENT_AMBULATORY_CARE_PROVIDER_SITE_OTHER): Payer: BC Managed Care – PPO | Admitting: Pharmacist

## 2019-01-17 VITALS — BP 111/71 | HR 65 | Temp 98.5°F | Wt 148.2 lb

## 2019-01-17 DIAGNOSIS — Z006 Encounter for examination for normal comparison and control in clinical research program: Secondary | ICD-10-CM

## 2019-01-17 NOTE — Research (Addendum)
Keith Becker is here for his final study visit for [place]. No new complaints or medications. Rapid HIV non-reactive today. He will transition to the PrEP clinic for medications and follow-up. He met with Keith Becker today. He is scheduled to return in March to see Keith Becker. He is interested in returning to study once cabotegravir is available in the next version of the study. Will contact him once available.

## 2019-01-18 LAB — COMPREHENSIVE METABOLIC PANEL
AG Ratio: 1.5 (calc) (ref 1.0–2.5)
ALT: 48 U/L — ABNORMAL HIGH (ref 9–46)
AST: 25 U/L (ref 10–40)
Albumin: 4.4 g/dL (ref 3.6–5.1)
Alkaline phosphatase (APISO): 61 U/L (ref 36–130)
BUN: 9 mg/dL (ref 7–25)
CO2: 29 mmol/L (ref 20–32)
Calcium: 9.5 mg/dL (ref 8.6–10.3)
Chloride: 104 mmol/L (ref 98–110)
Creat: 0.89 mg/dL (ref 0.60–1.35)
Globulin: 2.9 g/dL (calc) (ref 1.9–3.7)
Glucose, Bld: 101 mg/dL — ABNORMAL HIGH (ref 65–99)
Potassium: 3.6 mmol/L (ref 3.5–5.3)
Sodium: 141 mmol/L (ref 135–146)
Total Bilirubin: 0.5 mg/dL (ref 0.2–1.2)
Total Protein: 7.3 g/dL (ref 6.1–8.1)

## 2019-01-18 LAB — AMYLASE: Amylase: 94 U/L (ref 21–101)

## 2019-01-18 LAB — PHOSPHORUS: Phosphorus: 4.2 mg/dL (ref 2.5–4.5)

## 2019-01-18 LAB — CK: Total CK: 77 U/L (ref 44–196)

## 2019-01-18 LAB — C. TRACHOMATIS/N. GONORRHOEAE RNA
C. trachomatis RNA, TMA: NOT DETECTED
N. gonorrhoeae RNA, TMA: NOT DETECTED

## 2019-01-18 LAB — LIPASE: Lipase: 25 U/L (ref 7–60)

## 2019-01-18 LAB — HIV ANTIBODY (ROUTINE TESTING W REFLEX): HIV 1&2 Ab, 4th Generation: NONREACTIVE

## 2019-01-18 LAB — RPR: RPR Ser Ql: NONREACTIVE

## 2019-01-19 NOTE — Progress Notes (Signed)
Patient will be transitioning from PrEP study to clinic PrEP services. He has quite a bit of Truvada left and does not need a refill yet. I will see him in 3 month for a follow up.

## 2019-01-25 LAB — CT/NG RNA, TMA RECTAL
Chlamydia Trachomatis RNA: NOT DETECTED
Neisseria Gonorrhoeae RNA: NOT DETECTED

## 2019-04-18 ENCOUNTER — Telehealth: Payer: Self-pay | Admitting: Pharmacy Technician

## 2019-04-18 ENCOUNTER — Ambulatory Visit: Payer: BC Managed Care – PPO | Admitting: Pharmacist

## 2019-04-18 NOTE — Telephone Encounter (Signed)
[facility] Patient Advocate Encounter ° °Insurance verification completed.   ° °The patient is uninsured and will need patient assistance for medication. ° °We can complete the application and will need to meet with the patient for signatures and income documentation. ° °Antuane Eastridge E. Alvah Gilder, CPhT °Specialty Pharmacy Patient Advocate °[facility] °Phone: [phone] °Fax:  [phone] ° ° °

## 2019-04-20 NOTE — Progress Notes (Signed)
No show

## 2019-05-12 ENCOUNTER — Ambulatory Visit: Payer: Medicaid Other | Attending: Internal Medicine

## 2019-05-12 DIAGNOSIS — Z23 Encounter for immunization: Secondary | ICD-10-CM

## 2019-05-12 NOTE — Progress Notes (Signed)
   Covid-19 Vaccination Clinic  Name:  Keith Becker    MRN: [id number] DOB: 22-May-1985  05/12/2019  [name] was observed post Covid-19 immunization for 15 minutes without incident. He was provided with Vaccine Information Sheet and instruction to access the V-Safe system.   [name] was instructed to call 911 with any severe reactions post vaccine: Marland Kitchen Difficulty breathing  . Swelling of face and throat  . A fast heartbeat  . A bad rash all over body  . Dizziness and weakness   Immunizations Administered    Name Date Dose VIS Date Route   Pfizer COVID-19 Vaccine 05/12/2019  8:31 AM 0.3 mL 01/20/2019 Intramuscular   Manufacturer: [employer], [employer]   Lot: [id number]   NDC: [address]-6

## 2019-06-05 ENCOUNTER — Ambulatory Visit: Payer: Medicaid Other

## 2019-06-06 ENCOUNTER — Ambulatory Visit: Payer: Medicaid Other | Attending: Internal Medicine

## 2019-06-06 DIAGNOSIS — Z23 Encounter for immunization: Secondary | ICD-10-CM

## 2019-06-06 NOTE — Progress Notes (Signed)
   Covid-19 Vaccination Clinic  Name:  Jessup Ogas    MRN: [id number] DOB: 1985-05-10  06/06/2019  Mr. Swiderski was observed post Covid-19 immunization for 15 minutes without incident. He was provided with Vaccine Information Sheet and instruction to access the V-Safe system.   Mr. Racca was instructed to call 911 with any severe reactions post vaccine: Marland Kitchen Difficulty breathing  . Swelling of face and throat  . A fast heartbeat  . A bad rash all over body  . Dizziness and weakness   Immunizations Administered    Name Date Dose VIS Date Route   Pfizer COVID-19 Vaccine 06/06/2019 11:59 AM 0.3 mL 04/05/2018 Intramuscular   Manufacturer: [employer], [employer]   Lot: [id number]   NDC: [address]-7

## 2019-07-25 ENCOUNTER — Encounter (INDEPENDENT_AMBULATORY_CARE_PROVIDER_SITE_OTHER): Payer: Self-pay | Admitting: *Deleted

## 2019-07-25 ENCOUNTER — Other Ambulatory Visit: Payer: Self-pay

## 2019-07-25 VITALS — BP 114/75 | HR 64 | Temp 99.2°F | Wt 158.2 lb

## 2019-07-25 DIAGNOSIS — Z006 Encounter for examination for normal comparison and control in clinical research program: Secondary | ICD-10-CM

## 2019-07-25 NOTE — Research (Signed)
Keith Becker was here today to reenter the HPTN 083 study. He wants to continue on the injections, because he has trouble taking his pills. Informed consent was obtained after he reviewed the consent and understood his obligations regarding the study. We checked an HIV antibody test today and plan for him to come back next Wednesday to restart the injections. He has obtained his covid vaccines.

## 2019-07-26 ENCOUNTER — Encounter: Payer: Medicaid Other | Admitting: *Deleted

## 2019-07-26 LAB — HIV ANTIBODY (ROUTINE TESTING W REFLEX): HIV 1&2 Ab, 4th Generation: NONREACTIVE

## 2019-08-02 ENCOUNTER — Encounter: Payer: Medicaid Other | Admitting: *Deleted

## 2019-08-07 ENCOUNTER — Other Ambulatory Visit: Payer: Self-pay

## 2019-08-07 ENCOUNTER — Encounter (INDEPENDENT_AMBULATORY_CARE_PROVIDER_SITE_OTHER): Payer: Self-pay | Admitting: *Deleted

## 2019-08-07 VITALS — BP 123/80 | HR 59 | Temp 97.8°F | Wt 156.1 lb

## 2019-08-07 DIAGNOSIS — Z006 Encounter for examination for normal comparison and control in clinical research program: Secondary | ICD-10-CM

## 2019-08-07 MED ORDER — STUDY - HPTN 083 (STEP 4, OPEN-LABEL) - CABOTEGRAVIR 600MG/3ML INJECTION (PI-VAN DAM): 600.0000 mg | INJECTION | INTRAMUSCULAR | Status: DC

## 2019-08-07 NOTE — Research (Signed)
Keith Becker is here for his Step 4B week 0 HPTN 083 study visit. No new complaints or medications. He is excited to start back on the injections. Denied any symptoms of acute HIV infection. Rapid HIV non-reactive today. Cabotegravir injection given (R) glute without problem. He will return in July for his next study visit and injection.

## 2019-08-11 LAB — HEPATIC FUNCTION PANEL
AG Ratio: 1.8 (calc) (ref 1.0–2.5)
ALT: 10 U/L (ref 9–46)
AST: 10 U/L (ref 10–40)
Albumin: 4.4 g/dL (ref 3.6–5.1)
Alkaline phosphatase (APISO): 48 U/L (ref 36–130)
Bilirubin, Direct: 0.2 mg/dL (ref 0.0–0.2)
Globulin: 2.5 g/dL (calc) (ref 1.9–3.7)
Indirect Bilirubin: 0.5 mg/dL (calc) (ref 0.2–1.2)
Total Bilirubin: 0.7 mg/dL (ref 0.2–1.2)
Total Protein: 6.9 g/dL (ref 6.1–8.1)

## 2019-08-11 LAB — HIV ANTIBODY (ROUTINE TESTING W REFLEX): HIV 1&2 Ab, 4th Generation: NONREACTIVE

## 2019-08-11 LAB — CREATININE, SERUM: Creat: 0.89 mg/dL (ref 0.60–1.35)

## 2019-08-11 LAB — HIV-1 RNA QUANT-NO REFLEX-BLD
HIV 1 RNA Quant: <20 copies/mL
HIV-1 RNA Quant, Log: <1.3 Log copies/mL

## 2019-09-07 ENCOUNTER — Other Ambulatory Visit: Payer: Self-pay

## 2019-09-07 ENCOUNTER — Encounter (INDEPENDENT_AMBULATORY_CARE_PROVIDER_SITE_OTHER): Payer: Self-pay | Admitting: *Deleted

## 2019-09-07 VITALS — BP 124/70 | HR 67 | Temp 98.8°F | Wt 155.0 lb

## 2019-09-07 DIAGNOSIS — Z006 Encounter for examination for normal comparison and control in clinical research program: Secondary | ICD-10-CM

## 2019-09-07 NOTE — Research (Signed)
Keith Becker was here for his week 0 step 4c visit for [name] 083. He denies any new problems. He was contacted by the HD about a contact for HIV and was tested last week, all was negative. He was given an injection of cabotegravir in his left buttock without problem and will be returning in 8 weeks.

## 2019-09-13 LAB — HEPATIC FUNCTION PANEL
AG Ratio: 1.8 (calc) (ref 1.0–2.5)
ALT: 11 U/L (ref 9–46)
AST: 11 U/L (ref 10–40)
Albumin: 4.4 g/dL (ref 3.6–5.1)
Alkaline phosphatase (APISO): 50 U/L (ref 36–130)
Bilirubin, Direct: 0.2 mg/dL (ref 0.0–0.2)
Globulin: 2.5 g/dL (calc) (ref 1.9–3.7)
Indirect Bilirubin: 0.6 mg/dL (calc) (ref 0.2–1.2)
Total Bilirubin: 0.8 mg/dL (ref 0.2–1.2)
Total Protein: 6.9 g/dL (ref 6.1–8.1)

## 2019-09-13 LAB — CREATININE, SERUM: Creat: 0.95 mg/dL (ref 0.60–1.35)

## 2019-09-13 LAB — HIV-1 RNA QUANT-NO REFLEX-BLD
HIV 1 RNA Quant: <20 copies/mL
HIV-1 RNA Quant, Log: <1.3 Log copies/mL

## 2019-09-13 LAB — HIV ANTIBODY (ROUTINE TESTING W REFLEX): HIV 1&2 Ab, 4th Generation: NONREACTIVE

## 2019-11-01 ENCOUNTER — Other Ambulatory Visit: Payer: Self-pay

## 2019-11-01 ENCOUNTER — Encounter (INDEPENDENT_AMBULATORY_CARE_PROVIDER_SITE_OTHER): Payer: Self-pay | Admitting: *Deleted

## 2019-11-01 VITALS — BP 121/60 | HR 83 | Temp 98.0°F | Wt 154.1 lb

## 2019-11-01 DIAGNOSIS — Z006 Encounter for examination for normal comparison and control in clinical research program: Secondary | ICD-10-CM

## 2019-11-01 NOTE — Research (Signed)
Keith Becker seen today for his Step 4C Week 8 HPTN 083 study visit. Denied any injection site reaction after his last study visit. No new concerns or medications. Rapid HIV was non-reactive today. Cabotegravir injection given (L) glute without problem. He is scheduled to return in November for his next study visit.

## 2019-11-02 ENCOUNTER — Encounter: Payer: Self-pay | Admitting: *Deleted

## 2019-11-05 LAB — HIV ANTIBODY (ROUTINE TESTING W REFLEX): HIV 1&2 Ab, 4th Generation: NONREACTIVE

## 2019-11-05 LAB — HIV-1 RNA QUANT-NO REFLEX-BLD
HIV 1 RNA Quant: <20 Copies/mL
HIV-1 RNA Quant, Log: <1.3 Log cps/mL

## 2019-12-27 ENCOUNTER — Other Ambulatory Visit: Payer: Self-pay

## 2019-12-27 ENCOUNTER — Encounter (INDEPENDENT_AMBULATORY_CARE_PROVIDER_SITE_OTHER): Payer: Self-pay | Admitting: *Deleted

## 2019-12-27 VITALS — BP 111/71 | HR 61 | Temp 97.9°F | Wt 150.0 lb

## 2019-12-27 DIAGNOSIS — Z006 Encounter for examination for normal comparison and control in clinical research program: Secondary | ICD-10-CM

## 2019-12-27 NOTE — Research (Signed)
Keith Becker seen today for his Step 4C week 16 HPTN 083 study visit. Denied any problems after his last injection. No new complaints or medications. Rapid HIV was non-reactive today. Cabotegravir injection given (L) glute without problem. He will return in January for his next study visit.

## 2019-12-29 ENCOUNTER — Emergency Department (HOSPITAL_COMMUNITY)
Admission: EM | Admit: 2019-12-29 | Discharge: 2019-12-30 | Discharge status: Against Medical Advice | Payer: Medicaid Other

## 2019-12-29 LAB — HIV-1 RNA QUANT-NO REFLEX-BLD
HIV 1 RNA Quant: <20 Copies/mL
HIV-1 RNA Quant, Log: <1.3 Log cps/mL

## 2019-12-29 LAB — HIV ANTIBODY (ROUTINE TESTING W REFLEX): HIV 1&2 Ab, 4th Generation: NONREACTIVE

## 2019-12-29 NOTE — ED Notes (Signed)
Patient stated he does not want to wait to be seen and has left. Triage RN aware.

## 2020-02-22 ENCOUNTER — Encounter (INDEPENDENT_AMBULATORY_CARE_PROVIDER_SITE_OTHER): Payer: Self-pay | Admitting: *Deleted

## 2020-02-22 ENCOUNTER — Other Ambulatory Visit: Payer: Self-pay

## 2020-02-22 VITALS — BP 106/67 | HR 67 | Temp 98.3°F | Wt 154.4 lb

## 2020-02-22 DIAGNOSIS — Z006 Encounter for examination for normal comparison and control in clinical research program: Secondary | ICD-10-CM

## 2020-02-22 NOTE — Research (Signed)
Keith Becker seen today for his Step 4C week 24 HPTN 083 visit. No new complaints or medications. Denied any problems after his last injection. Rapid HIV non-reactive today. Cabotegravir injection given (L) glute without problem. He will return in March for his next visit.

## 2020-02-23 LAB — C. TRACHOMATIS/N. GONORRHOEAE RNA
C. trachomatis RNA, TMA: NOT DETECTED
N. gonorrhoeae RNA, TMA: NOT DETECTED

## 2020-02-23 LAB — CT/NG RNA, TMA RECTAL
Chlamydia Trachomatis RNA: NOT DETECTED
Neisseria Gonorrhoeae RNA: NOT DETECTED

## 2020-02-27 LAB — HEPATIC FUNCTION PANEL
AG Ratio: 1.7 (calc) (ref 1.0–2.5)
ALT: 14 U/L (ref 9–46)
AST: 14 U/L (ref 10–40)
Albumin: 4.3 g/dL (ref 3.6–5.1)
Alkaline phosphatase (APISO): 52 U/L (ref 36–130)
Bilirubin, Direct: 0.1 mg/dL (ref 0.0–0.2)
Globulin: 2.5 g/dL (calc) (ref 1.9–3.7)
Indirect Bilirubin: 0.3 mg/dL (calc) (ref 0.2–1.2)
Total Bilirubin: 0.4 mg/dL (ref 0.2–1.2)
Total Protein: 6.8 g/dL (ref 6.1–8.1)

## 2020-02-27 LAB — RPR: RPR Ser Ql: NONREACTIVE

## 2020-02-27 LAB — HIV-1 RNA QUANT-NO REFLEX-BLD
HIV 1 RNA Quant: <20 Copies/mL
HIV-1 RNA Quant, Log: <1.3 Log cps/mL

## 2020-02-27 LAB — HIV ANTIBODY (ROUTINE TESTING W REFLEX): HIV 1&2 Ab, 4th Generation: NONREACTIVE

## 2020-02-27 LAB — CREATININE, SERUM: Creat: 0.93 mg/dL (ref 0.60–1.35)

## 2020-04-18 ENCOUNTER — Encounter (INDEPENDENT_AMBULATORY_CARE_PROVIDER_SITE_OTHER): Payer: Self-pay | Admitting: *Deleted

## 2020-04-18 ENCOUNTER — Other Ambulatory Visit: Payer: Self-pay

## 2020-04-18 VITALS — BP 129/81 | HR 58 | Temp 98.0°F | Wt 150.1 lb

## 2020-04-18 DIAGNOSIS — Z006 Encounter for examination for normal comparison and control in clinical research program: Secondary | ICD-10-CM

## 2020-04-18 NOTE — Research (Signed)
Javontay seen today for his Step 4C Week 32 HPTN 083 study visit. Denied any site reactions after his last injection. States that he tested positive and was [facility]. No other complaints. Rapid HIV was non-reactive today. Cabotegravir injection given (L) glute without problem. He will return in May for his next study visit.

## 2020-04-21 LAB — HIV-1 RNA QUANT-NO REFLEX-BLD
HIV 1 RNA Quant: NOT DETECTED Copies/mL
HIV-1 RNA Quant, Log: NOT DETECTED Log cps/mL

## 2020-04-21 LAB — HIV ANTIBODY (ROUTINE TESTING W REFLEX): HIV 1&2 Ab, 4th Generation: NONREACTIVE

## 2020-06-13 ENCOUNTER — Encounter (INDEPENDENT_AMBULATORY_CARE_PROVIDER_SITE_OTHER): Payer: Self-pay | Admitting: *Deleted

## 2020-06-13 ENCOUNTER — Other Ambulatory Visit: Payer: Self-pay

## 2020-06-13 VITALS — BP 110/73 | HR 62 | Temp 98.2°F | Wt 146.0 lb

## 2020-06-13 DIAGNOSIS — Z006 Encounter for examination for normal comparison and control in clinical research program: Secondary | ICD-10-CM

## 2020-06-13 NOTE — Research (Signed)
Keith Becker here today for his Step 4C week 40 HPTN 083 study visit. Denied any site reaction after his last injection. No new complaints or medications. Rapid HIV was non-reactive today. He received cabotegravir injection to (L) glute without problem. He will return in June for his next study visit.

## 2020-06-16 LAB — HIV-1 RNA QUANT-NO REFLEX-BLD
HIV 1 RNA Quant: NOT DETECTED Copies/mL
HIV-1 RNA Quant, Log: NOT DETECTED Log cps/mL

## 2020-06-16 LAB — HIV ANTIBODY (ROUTINE TESTING W REFLEX): HIV 1&2 Ab, 4th Generation: NONREACTIVE

## 2020-07-31 ENCOUNTER — Other Ambulatory Visit (HOSPITAL_COMMUNITY): Payer: Self-pay

## 2020-08-08 ENCOUNTER — Other Ambulatory Visit: Payer: Self-pay

## 2020-08-08 ENCOUNTER — Encounter: Payer: BC Managed Care – PPO | Admitting: *Deleted

## 2020-08-08 ENCOUNTER — Encounter (INDEPENDENT_AMBULATORY_CARE_PROVIDER_SITE_OTHER): Payer: Self-pay | Admitting: *Deleted

## 2020-08-08 VITALS — BP 121/78 | HR 57 | Temp 98.2°F | Wt 144.2 lb

## 2020-08-08 DIAGNOSIS — Z006 Encounter for examination for normal comparison and control in clinical research program: Secondary | ICD-10-CM

## 2020-08-08 NOTE — Research (Signed)
Keith Becker was here for his week 48, step 4c visit for HPTN 083. He said the last injection was fine, he didn't notice anything beyond that day. His Rt eye is red and inflamed due to injury occurring Sunday. He  needs to go to Urgent care to have it looked at and treated asap. He says he is going tomorrow. Tresa Endo assessed him but we do not have equipment in the clinic for an eye exam that would be needed to get a good look at the eye. He may potentially have some debris or an abrasion in his eye. He does report light sensitivity.  He received an injection of cabotegravir in his rt buttock without problem today and will return in 8 weeks.

## 2020-08-09 LAB — CT/NG RNA, TMA RECTAL
Chlamydia Trachomatis RNA: NOT DETECTED
Neisseria Gonorrhoeae RNA: NOT DETECTED

## 2020-08-09 LAB — C. TRACHOMATIS/N. GONORRHOEAE RNA
C. trachomatis RNA, TMA: NOT DETECTED
N. gonorrhoeae RNA, TMA: NOT DETECTED

## 2020-08-13 ENCOUNTER — Telehealth: Payer: Self-pay | Admitting: *Deleted

## 2020-08-13 LAB — RPR TITER: RPR Titer: 1:1 {titer} — ABNORMAL HIGH

## 2020-08-13 LAB — HEPATIC FUNCTION PANEL
AG Ratio: 1.5 (calc) (ref 1.0–2.5)
ALT: 12 U/L (ref 9–46)
AST: 11 U/L (ref 10–40)
Albumin: 4 g/dL (ref 3.6–5.1)
Alkaline phosphatase (APISO): 54 U/L (ref 36–130)
Bilirubin, Direct: 0.1 mg/dL (ref 0.0–0.2)
Globulin: 2.6 g/dL (calc) (ref 1.9–3.7)
Indirect Bilirubin: 0.4 mg/dL (calc) (ref 0.2–1.2)
Total Bilirubin: 0.5 mg/dL (ref 0.2–1.2)
Total Protein: 6.6 g/dL (ref 6.1–8.1)

## 2020-08-13 LAB — HIV-1 RNA QUANT-NO REFLEX-BLD
HIV 1 RNA Quant: NOT DETECTED Copies/mL
HIV-1 RNA Quant, Log: NOT DETECTED Log cps/mL

## 2020-08-13 LAB — HEPATITIS C ANTIBODY
Hepatitis C Ab: NONREACTIVE
SIGNAL TO CUT-OFF: 0.02 (ref <1.00)

## 2020-08-13 LAB — CREATININE, SERUM: Creat: 0.88 mg/dL (ref 0.60–1.35)

## 2020-08-13 LAB — RPR: RPR Ser Ql: REACTIVE — AB

## 2020-08-13 LAB — FLUORESCENT TREPONEMAL AB(FTA)-IGG-BLD: Fluorescent Treponemal ABS: REACTIVE — AB

## 2020-08-13 LAB — HIV ANTIBODY (ROUTINE TESTING W REFLEX): HIV 1&2 Ab, 4th Generation: NONREACTIVE

## 2020-08-13 NOTE — Telephone Encounter (Signed)
Research aware, will be contacting/treating patient. Andree Coss, RN

## 2020-08-13 NOTE — Telephone Encounter (Signed)
-----   Message from Randall Hiss, MD sent at 08/13/2020  1:40 PM EDT ----- Pt needs 2.4 MU of PCN and partners offeredtesting and treatment as well as PreP HERE

## 2020-09-09 ENCOUNTER — Other Ambulatory Visit (HOSPITAL_COMMUNITY): Payer: Self-pay

## 2020-09-10 ENCOUNTER — Other Ambulatory Visit (HOSPITAL_COMMUNITY): Payer: Self-pay

## 2020-09-10 ENCOUNTER — Telehealth: Payer: Self-pay

## 2020-09-10 NOTE — Telephone Encounter (Signed)
[facility] Patient Advocate Encounter  Prior Authorization for Keith Becker has been approved.    PA# [id number] Effective dates: 09/09/20 through 09/09/21    [facility] Clinic will continue to follow.  [name], CPhT Specialty Pharmacy Patient [facility] Phone: [phone] Fax:  [phone]

## 2020-09-16 ENCOUNTER — Other Ambulatory Visit: Payer: Self-pay | Admitting: Pharmacist

## 2020-09-16 ENCOUNTER — Other Ambulatory Visit (HOSPITAL_COMMUNITY): Payer: Self-pay

## 2020-09-16 ENCOUNTER — Telehealth: Payer: Self-pay

## 2020-09-16 DIAGNOSIS — Z79899 Other long term (current) drug therapy: Secondary | ICD-10-CM

## 2020-09-16 MED ORDER — APRETUDE 600 MG/3ML IM SUER
600.0000 mg | INTRAMUSCULAR | 5 refills | Status: DC
  Filled 2020-09-16 (×2): qty 3, 60d supply, fill #0

## 2020-09-16 NOTE — Telephone Encounter (Signed)
[facility] Patient Advocate Encounter   Was successful in obtaining a Viiv copay card for [name].  This copay card will make the patients copay 0.00.  I have spoken with the patient.    The billing information is as follows and has been shared with [facility].        Elmo Shumard, CPhT Specialty Pharmacy Patient Advocate [facility] Phone: [phone] Fax:  [phone]  

## 2020-09-17 ENCOUNTER — Telehealth: Payer: Self-pay

## 2020-09-17 ENCOUNTER — Other Ambulatory Visit (HOSPITAL_COMMUNITY): Payer: Self-pay

## 2020-09-17 NOTE — Telephone Encounter (Signed)
[facility] Patient Advocate Encounter  Completed and sent ViiVConnect application for [name] for this patient who is uninsured.    Patient assistance phone number for follow up is [phone].   This encounter will be updated until final determination.   [name], CPhT Specialty Pharmacy Patient [facility] Phone: [phone] Fax:  [phone]

## 2020-09-18 ENCOUNTER — Telehealth: Payer: Self-pay

## 2020-09-18 NOTE — Telephone Encounter (Signed)
[facility] Patient Advocate Encounter  Completed and sent ViiVConnect application for [name] for this patient who is uninsured.    Patient is approved 09/18/20 through 09/18/21.  I will set up the refill with [facility] @ [phone].   [name], CPhT Specialty Pharmacy Patient [facility] Phone: [phone] Fax:  [phone]

## 2020-09-30 ENCOUNTER — Telehealth: Payer: Self-pay

## 2020-09-30 NOTE — Telephone Encounter (Signed)
[facility] Patient Advocate Encounter  Patient's medication ([name])have been couriered to [facility] from [facility] and will be administered on patient next office visit on 10/03/20.  (ViiVConnect Patient Assistance Program)  [name] , CPhT Specialty Pharmacy Patient [facility] Phone: [phone] Fax:  [phone]

## 2020-10-03 ENCOUNTER — Other Ambulatory Visit: Payer: Self-pay

## 2020-10-03 ENCOUNTER — Ambulatory Visit (INDEPENDENT_AMBULATORY_CARE_PROVIDER_SITE_OTHER): Payer: Self-pay | Admitting: Pharmacist

## 2020-10-03 DIAGNOSIS — Z79899 Other long term (current) drug therapy: Secondary | ICD-10-CM

## 2020-10-03 DIAGNOSIS — Z113 Encounter for screening for infections with a predominantly sexual mode of transmission: Secondary | ICD-10-CM

## 2020-10-03 MED ORDER — CABOTEGRAVIR ER 600 MG/3ML IM SUER
600.0000 mg | Freq: Once | INTRAMUSCULAR | Status: AC
  Administered 2020-10-03: 600 mg via INTRAMUSCULAR

## 2020-10-03 NOTE — Progress Notes (Signed)
HPI: Keith Becker is a 35 y.o. male who presents to the [facility] pharmacy clinic for [name] administration and HIV PrEP follow up.  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   CABOTEGRAVIR ER ([name]) 600 MG/3ML INJECTION    Inject 3 mLs (600 mg total) into the muscle every 2 (two) months.   VALACYCLOVIR (VALTREX) 500 MG TABLET    Take 1 tablet (500 mg total) by mouth 2 (two) times daily.  Modified Medications   No medications on file  Discontinued Medications   No medications on file    Allergies: Allergies  Allergen Reactions   Grass Pollen(K-O-R-T-Swt Vern) Itching   Pollen Extract-Tree Extract [Pollen Extract] Itching    Past Medical History: Past Medical History:  Diagnosis Date   Asthma    Seasonal allergies    Secondary syphilis     Social History: Social History   Socioeconomic History   Marital status: Single    Spouse name: Not on file   Number of children: Not on file   Years of education: Not on file   Highest education level: Not on file  Occupational History   Occupation: customer services    Employer: [employer]  Tobacco Use   Smoking status: Former   Smokeless tobacco: Not on file  Substance and Sexual Activity   Alcohol use: Yes    Alcohol/week: 0.0 standard drinks    Comment: social wine   Drug use: Yes    Frequency: 4.0 times per week    Types: Marijuana   Sexual activity: Yes    Partners: Male    Birth control/protection: Condom    Comment: SSP  Other Topics Concern   Not on file  Social History Narrative   Not on file   Social Determinants of Health    Financial Resource Strain: Not on file  Food Insecurity: Not on file  Transportation Needs: Not on file  Physical Activity: Not on file  Stress: Not on file  Social Connections: Not on file    Labs: Lab Results  Component Value Date   HIV1RNAQUANT Not Detected 08/08/2020   HIV1RNAQUANT Not Detected 06/13/2020   HIV1RNAQUANT Not Detected 04/18/2020    RPR and STI Lab Results  Component Value Date   LABRPR REACTIVE (A) 08/08/2020   LABRPR NON-REACTIVE 02/22/2020   LABRPR NON-REACTIVE 01/17/2019   LABRPR NON-REACTIVE 08/03/2018   LABRPR NON-REACTIVE 02/16/2018   RPRTITER 1:1 (H) 08/08/2020   RPRTITER 1:2 11/08/2013    STI Results GC GC CT CT  09/30/2016 - NOT DETECTED - NOT DETECTED  04/22/2016 - NOT DETECTED - NOT DETECTED  11/20/2015 - NOT DETECTED - NOT DETECTED  03/20/2015 - NOT DETECTED - NOT DETECTED  03/11/2015 *Negative - *Negative -  09/29/2014 - NEGATIVE - NEGATIVE  06/14/2014 Negative* - Negative* -  06/14/2014 Negative - Negative -  03/29/2014 NG: Negative - CT: Negative -  12/27/2013 - NEGATIVE - NEGATIVE  11/08/2013 - NEGATIVE - NEGATIVE  05/08/2013 - NEGATIVE - NEGATIVE  11/15/2012 - NEGATIVE - NEGATIVE    Hepatitis B Lab Results  Component Value Date  HEPBSAB POS (A) 03/20/2015   HEPBSAG NEGATIVE 03/11/2015   HEPBCAB NON REACTIVE 03/20/2015   Hepatitis C Lab Results  Component Value Date   HEPCAB NON-REACTIVE 08/08/2020   Hepatitis A No results found for: HAV Lipids: Lab Results  Component Value Date   CHOL 138 03/24/2017   TRIG 39 03/24/2017   HDL 46 03/24/2017   CHOLHDL 3.0 03/24/2017   VLDL 7 04/22/2016   LDLCALC 81 03/24/2017    TARGET DATE: The 25th of the month  Assessment: Keith Becker presents today for their [name] injection and to follow up for HIV PrEP. He is transitioning off of our HPTN research study. Past injections were tolerated well without issues. No problems with systemic side effects of injection. Patient did experience  some mild pain for a few hours.  No signs or symptoms of anything today. Screened patient for acute HIV symptoms such as fatigue, muscle aches, rash, sore throat, lymphadenopathy, headache, night sweats, nausea/vomiting/diarrhea, and fever. Patient denies any symptoms. No new partners since last injection. Denies condom use. Counseled that condoms are still advised and encouraged. He has been with his same partner for 2 years but unsure if his partner has other partners. He is the top/insertive partner with each sexual encounter. Rapid HIV antibody blood test was drawn immediately prior to injection and was negative. Patient also had HIV RNA drawn today.   Administered cabotegravir 600mg /6mL in left upper outer quadrant of the gluteal muscle. Monitored patient for 10 minutes after injection. Injection was tolerated well without issue.  Last STI screening was 08/08/20 and was negative. No need for further kidney monitoring with [name]. Will see him back in 2 months for injection, labs, and HIV PrEP follow up.  Plan: - [name] injection administered - HIV RNA, RPR, urine cytology today - Next injection, labs, and PrEP follow up appointment scheduled for 10/25 with me - Call with any issues or questions  Nanette Wirsing L. Bernadette Gores, PharmD, BCIDP, AAHIVP, CPP Clinical Pharmacist Practitioner Infectious Diseases Clinical Pharmacist [facility]

## 2020-10-04 LAB — URINE CYTOLOGY ANCILLARY ONLY
Chlamydia: NEGATIVE
Comment: NEGATIVE
Comment: NORMAL
Neisseria Gonorrhea: NEGATIVE

## 2020-10-04 LAB — CYTOLOGY, (ORAL, ANAL, URETHRAL) ANCILLARY ONLY
Chlamydia: NEGATIVE
Comment: NEGATIVE
Comment: NORMAL
Neisseria Gonorrhea: NEGATIVE

## 2020-10-06 LAB — HIV-1 RNA QUANT-NO REFLEX-BLD
HIV 1 RNA Quant: NOT DETECTED Copies/mL
HIV-1 RNA Quant, Log: NOT DETECTED Log cps/mL

## 2020-10-06 LAB — RPR: RPR Ser Ql: NONREACTIVE

## 2020-11-15 DIAGNOSIS — Z0389 Encounter for observation for other suspected diseases and conditions ruled out: Secondary | ICD-10-CM | POA: Diagnosis not present

## 2020-11-15 DIAGNOSIS — Z1388 Encounter for screening for disorder due to exposure to contaminants: Secondary | ICD-10-CM | POA: Diagnosis not present

## 2020-11-15 DIAGNOSIS — Z3009 Encounter for other general counseling and advice on contraception: Secondary | ICD-10-CM | POA: Diagnosis not present

## 2020-11-26 ENCOUNTER — Telehealth: Payer: Self-pay

## 2020-11-26 NOTE — Telephone Encounter (Signed)
[facility] Patient Advocate Encounter  Patient's medication ([name]) have been couriered to [facility] from [facility] and will be administered on patient next office visit on 12/03/20.  [name] , CPhT Specialty Pharmacy Patient [facility] Phone: [phone] Fax:  [phone]

## 2020-12-03 ENCOUNTER — Other Ambulatory Visit: Payer: Self-pay

## 2020-12-03 ENCOUNTER — Ambulatory Visit (INDEPENDENT_AMBULATORY_CARE_PROVIDER_SITE_OTHER): Payer: Self-pay | Admitting: Pharmacist

## 2020-12-03 DIAGNOSIS — Z79899 Other long term (current) drug therapy: Secondary | ICD-10-CM

## 2020-12-03 MED ORDER — CABOTEGRAVIR ER 600 MG/3ML IM SUER
600.0000 mg | Freq: Once | INTRAMUSCULAR | Status: AC
  Administered 2020-12-03: 600 mg via INTRAMUSCULAR

## 2020-12-03 NOTE — Progress Notes (Signed)
HPI: Keith Becker is a 35 y.o. male who presents to the [facility] pharmacy clinic for [name] administration and HIV PrEP follow up.  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   CABOTEGRAVIR ER ([name]) 600 MG/3ML INJECTION    Inject 3 mLs (600 mg total) into the muscle every 2 (two) months.   VALACYCLOVIR (VALTREX) 500 MG TABLET    Take 1 tablet (500 mg total) by mouth 2 (two) times daily.  Modified Medications   No medications on file  Discontinued Medications   No medications on file    Allergies: Allergies  Allergen Reactions   Grass Pollen(K-O-R-T-Swt Vern) Itching   Pollen Extract-Tree Extract [Pollen Extract] Itching    Past Medical History: Past Medical History:  Diagnosis Date   Asthma    Seasonal allergies    Secondary syphilis     Social History: Social History   Socioeconomic History   Marital status: Single    Spouse name: Not on file   Number of children: Not on file   Years of education: Not on file   Highest education level: Not on file  Occupational History   Occupation: customer services    Employer: [employer]  Tobacco Use   Smoking status: Former   Smokeless tobacco: Not on file  Substance and Sexual Activity   Alcohol use: Yes    Alcohol/week: 0.0 standard drinks    Comment: social wine   Drug use: Yes    Frequency: 4.0 times per week    Types: Marijuana   Sexual activity: Yes    Partners: Male    Birth control/protection: Condom    Comment: SSP  Other Topics Concern   Not on file  Social History Narrative   Not on file   Social Determinants of Health    Financial Resource Strain: Not on file  Food Insecurity: Not on file  Transportation Needs: Not on file  Physical Activity: Not on file  Stress: Not on file  Social Connections: Not on file    Labs: Lab Results  Component Value Date   HIV1RNAQUANT Not Detected 10/03/2020   HIV1RNAQUANT Not Detected 08/08/2020   HIV1RNAQUANT Not Detected 06/13/2020    RPR and STI Lab Results  Component Value Date   LABRPR NON-REACTIVE 10/03/2020   LABRPR REACTIVE (A) 08/08/2020   LABRPR NON-REACTIVE 02/22/2020   LABRPR NON-REACTIVE 01/17/2019   LABRPR NON-REACTIVE 08/03/2018   RPRTITER 1:1 (H) 08/08/2020   RPRTITER 1:2 11/08/2013    STI Results GC GC CT CT  10/03/2020 Negative - Negative -  10/03/2020 Negative - Negative -  09/30/2016 - NOT DETECTED - NOT DETECTED  04/22/2016 - NOT DETECTED - NOT DETECTED  11/20/2015 - NOT DETECTED - NOT DETECTED  03/20/2015 - NOT DETECTED - NOT DETECTED  03/11/2015 *Negative - *Negative -  09/29/2014 - NEGATIVE - NEGATIVE  06/14/2014 Negative* - Negative* -  06/14/2014 Negative - Negative -  03/29/2014 NG: Negative - CT: Negative -  12/27/2013 - NEGATIVE - NEGATIVE  11/08/2013 - NEGATIVE - NEGATIVE  05/08/2013 - NEGATIVE - NEGATIVE  11/15/2012 - NEGATIVE - NEGATIVE  Hepatitis B Lab Results  Component Value Date   HEPBSAB POS (A) 03/20/2015   HEPBSAG NEGATIVE 03/11/2015   HEPBCAB NON REACTIVE 03/20/2015   Hepatitis C Lab Results  Component Value Date   HEPCAB NON-REACTIVE 08/08/2020   Hepatitis A No results found for: HAV Lipids: Lab Results  Component Value Date   CHOL 138 03/24/2017   TRIG 39 03/24/2017   HDL 46 03/24/2017   CHOLHDL 3.0 03/24/2017   VLDL 7 04/22/2016   LDLCALC 81 03/24/2017    TARGET DATE: The 25th of the month  Assessment: Keith Becker presents today for their [name] injection and to follow up for HIV PrEP. Initial/past injection was tolerated well without issues. No problems with systemic side effects of injection. No  signs or symptoms of anything today. Screened patient for acute HIV symptoms such as fatigue, muscle aches, rash, sore throat, lymphadenopathy, headache, night sweats, nausea/vomiting/diarrhea, and fever. Patient denies any symptoms. No new partners since last injection. Rapid HIV antibody blood test was drawn immediately prior to injection and was negative. Patient also had HIV RNA drawn today.   Administered cabotegravir 600mg /9mL in left upper outer quadrant of the gluteal muscle. Monitored patient for 10 minutes after injection. Injection was tolerated well without issue.  Last STI screening was 10/03/20 and was negative. No need for further kidney monitoring with [name]. Will see him back in 2 months for injection, labs, and HIV PrEP follow up.  Plan: - [name] injection administered - HIV RNA, RPR, urine cytology today - Next injection, labs, and PrEP follow up appointment scheduled for 01/30/21 - Call with any issues or questions  Keith Becker L. Keith Becker, PharmD, BCIDP, AAHIVP, CPP Clinical Pharmacist Practitioner Infectious Diseases Clinical Pharmacist [facility]

## 2020-12-04 ENCOUNTER — Telehealth: Payer: Self-pay

## 2020-12-04 NOTE — Telephone Encounter (Signed)
Called patient after receiving notice from Cytology that patient's urine cytology was not able to be tested due to name on tube not matching requisition. Spoke with patient who would like to defer coming back for lab. Is okay with waiting until next appointment. Informed Cassie Pharmacist.  Juanita Laster, RMA

## 2020-12-06 LAB — RPR: RPR Ser Ql: NONREACTIVE

## 2020-12-06 LAB — HIV-1 RNA QUANT-NO REFLEX-BLD
HIV 1 RNA Quant: NOT DETECTED Copies/mL
HIV-1 RNA Quant, Log: NOT DETECTED Log cps/mL

## 2020-12-26 ENCOUNTER — Telehealth: Payer: Self-pay

## 2020-12-26 NOTE — Telephone Encounter (Signed)
[facility] Patient Advocate Encounter  Patient's medication ([name]) have been couriered to [facility] from [facility] and will be administered on the patient next office visit on 01/30/21.  [name] , CPhT Specialty Pharmacy Patient [facility] for Infectious Disease Phone: [phone] Fax:  [phone]

## 2021-01-01 ENCOUNTER — Ambulatory Visit (INDEPENDENT_AMBULATORY_CARE_PROVIDER_SITE_OTHER): Payer: Self-pay | Admitting: Internal Medicine

## 2021-01-01 ENCOUNTER — Other Ambulatory Visit: Payer: Self-pay

## 2021-01-01 VITALS — BP 135/75 | HR 95 | Temp 98.7°F | Wt 142.0 lb

## 2021-01-01 DIAGNOSIS — Z0389 Encounter for observation for other suspected diseases and conditions ruled out: Secondary | ICD-10-CM | POA: Diagnosis not present

## 2021-01-01 DIAGNOSIS — Z113 Encounter for screening for infections with a predominantly sexual mode of transmission: Secondary | ICD-10-CM

## 2021-01-01 DIAGNOSIS — Z3009 Encounter for other general counseling and advice on contraception: Secondary | ICD-10-CM | POA: Diagnosis not present

## 2021-01-01 DIAGNOSIS — Z1388 Encounter for screening for disorder due to exposure to contaminants: Secondary | ICD-10-CM | POA: Diagnosis not present

## 2021-01-01 DIAGNOSIS — B04 Monkeypox: Secondary | ICD-10-CM

## 2021-01-01 NOTE — Progress Notes (Signed)
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   MRN: [id number]  Chief Complaint  Patient presents with   Rash    STARTED Monday, BUMPS AROUND LIP. RIGHT LOWER LIP SWOLLEN. SWELLING DECREASING.     HPI:  Keith Becker is a 35 y.o. male high risk sexual behavior, on prep here for concern of monkey pox   He previously follow dr Tommy Medal for PreP and has been taking truvada  He has std screening 09/2020 and 11/2020 negative RPR (previous hx syphylis treated). Gc/chlam negative in 09/2020 triple screen. Hiv negative.   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 No f/c. Has 3 lesion, one on penile shaft and 2 on his lower chin. Assypmtomatic unless touched then it feels a little raw. Papule/ulcerated.  Feels well otherwise, no headache, cough, chest pain, sorethroat  Wants std screening  [facility] and [facility] He has first part of vaccine but not second part yet  Allergies  Allergen Reactions   Grass Pollen(K-O-R-T-Swt Vern) Itching   Pollen Extract-Tree Extract [Pollen Extract] Itching      Outpatient Medications Prior to Visit  Medication Sig Dispense Refill   acyclovir (ZOVIRAX) 200 MG capsule Take 1 capsule (200 mg total) by mouth 5 (five) times daily. 20 capsule 0   cabotegravir ER ([name]) 600 MG/3ML injection Inject 3 mLs (600 mg total) into the muscle every 2 (two) months. 3 mL 5   valACYclovir (VALTREX) 500 MG tablet Take 1 tablet (500 mg total) by mouth 2 (two) times daily. 14 tablet 6   No facility-administered medications prior to visit.     Social History    Socioeconomic History   Marital status: Single    Spouse name: Not on file   Number of children: Not on file   Years of education: Not on file   Highest education level: Not on file  Occupational History   Occupation: customer services    Employer: [employer]  Tobacco Use   Smoking status: Former   Smokeless tobacco: Not on file  Substance and Sexual Activity   Alcohol use: Yes    Alcohol/week: 0.0 standard drinks    Comment: social wine   Drug use: Yes    Frequency: 4.0 times per week    Types: Marijuana   Sexual activity: Yes    Partners: Male    Birth control/protection: Condom    Comment: SSP  Other Topics Concern   Not on file  Social History Narrative   Not on file   Social Determinants of Health   Financial Resource Strain: Not on file  Food Insecurity: Not on file  Transportation Needs: Not on file  Physical Activity: Not on file  Stress: Not on file  Social Connections: Not on file  Intimate Partner Violence: Not on file      Review of Systems    All other ros negative   Objective:    BP 135/75   Pulse 95   Temp 98.7 F (37.1 C) (Oral)   Wt 142 lb (64.4  kg)   SpO2 98%   BMI 20.09 kg/m  Nursing note and vital signs reviewed.  Physical Exam     General/constitutional: no distress, pleasant HEENT: Normocephalic, PER, Conj Clear, EOMI, Oropharynx clear Neck supple CV: rrr no mrg Lungs: clear to auscultation, normal respiratory effort Abd: Soft, Nontender Ext: no edema Skin: see picture for facial rash Neuro: nonfocal MSK: no peripheral joint swelling/tenderness/warmth; back spines nontender     Labs:  Micro:  Serology:  Imaging:  Assessment & Plan:   Problem List Items Addressed This Visit   None Visit Diagnoses     Monkeypox    -  Primary        Mild disease -- no tx indicated  Std screen include triple screen/rpr/hepatitis  F/u 4-6 weeks  Patient just [facility] blood[facility]swab [facility] and will f/u in a few days  for results -- ordered blood tests for other std screening (will keep on chart in case he needs)  Infection prevention measures discussed  Patient will also be followed by his appretude research team  No orders of the defined types were placed in this encounter.       Follow-up: Return in about 6 weeks (around 02/12/2021).      Raymondo Band, MD [facility] for Infectious Disease [facility] [phone]  pager   [phone] cell 01/01/2021, 3:05 PM

## 2021-01-01 NOTE — Patient Instructions (Signed)
Continue to monitor; avoid touching lesion Take tylenol or advil as needed if lesions very uncomfortable  If more lesions occur on face/coalescing together with severe pain please call our clinic  Wash surface at home (tables/door) with frequent bleach solution Avoid skin to skin contact with others until all lesions crust over   [facility] your monkey pox vaccine after you healed this infection  Std screen today  Follow up in 4-6 weeks

## 2021-01-03 LAB — CYTOLOGY, (ORAL, ANAL, URETHRAL) ANCILLARY ONLY
Chlamydia: NEGATIVE
Chlamydia: NEGATIVE
Comment: NEGATIVE
Comment: NEGATIVE
Comment: NORMAL
Comment: NORMAL
Neisseria Gonorrhea: NEGATIVE
Neisseria Gonorrhea: NEGATIVE

## 2021-01-03 LAB — URINE CYTOLOGY ANCILLARY ONLY
Chlamydia: NEGATIVE
Comment: NEGATIVE
Comment: NEGATIVE
Comment: NORMAL
Neisseria Gonorrhea: NEGATIVE
Trichomonas: NEGATIVE

## 2021-01-16 ENCOUNTER — Telehealth: Payer: Self-pay | Admitting: Pharmacist

## 2021-01-16 NOTE — Telephone Encounter (Signed)
Patient's medication ([name]) have been couriered to [facility] from [facility] and will be administered on the patient next office visit.

## 2021-01-30 ENCOUNTER — Ambulatory Visit: Payer: Medicaid Other | Admitting: Pharmacist

## 2021-01-30 ENCOUNTER — Other Ambulatory Visit: Payer: Medicaid Other

## 2021-02-04 ENCOUNTER — Ambulatory Visit (INDEPENDENT_AMBULATORY_CARE_PROVIDER_SITE_OTHER): Payer: Self-pay | Admitting: Pharmacist

## 2021-02-04 ENCOUNTER — Other Ambulatory Visit: Payer: Self-pay

## 2021-02-04 DIAGNOSIS — Z79899 Other long term (current) drug therapy: Secondary | ICD-10-CM

## 2021-02-05 ENCOUNTER — Ambulatory Visit (INDEPENDENT_AMBULATORY_CARE_PROVIDER_SITE_OTHER): Payer: Self-pay | Admitting: Pharmacist

## 2021-02-05 ENCOUNTER — Other Ambulatory Visit: Payer: Self-pay

## 2021-02-05 DIAGNOSIS — Z79899 Other long term (current) drug therapy: Secondary | ICD-10-CM

## 2021-02-05 MED ORDER — CABOTEGRAVIR ER 600 MG/3ML IM SUER
600.0000 mg | Freq: Once | INTRAMUSCULAR | Status: AC
Start: 2021-02-05 — End: 2021-02-05
  Administered 2021-02-05: 11:00:00 600 mg via INTRAMUSCULAR

## 2021-02-05 NOTE — Progress Notes (Signed)
Patient left without being seen. Reschedule for tomorrow.

## 2021-02-05 NOTE — Progress Notes (Signed)
HPI: [name] is a 35 y.o. male who presents to the [facility] clinic for [name] administration and HIV PrEP follow up.  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   CABOTEGRAVIR ER ([name]) 600 MG/3ML INJECTION    Inject 3 mLs (600 mg total) into the muscle every 2 (two) months.   VALACYCLOVIR (VALTREX) 500 MG TABLET    Take 1 tablet (500 mg total) by mouth 2 (two) times daily.  Modified Medications   No medications on file  Discontinued Medications   No medications on file    Allergies: Allergies  Allergen Reactions   Grass Pollen(K-O-R-T-Swt Vern) Itching   Pollen Extract-Tree Extract [Pollen Extract] Itching    Past Medical History: Past Medical History:  Diagnosis Date   Asthma    Seasonal allergies    Secondary syphilis     Social History: Social History   Socioeconomic History   Marital status: Single    Spouse name: Not on file   Number of children: Not on file   Years of education: Not on file   Highest education level: Not on file  Occupational History   Occupation: customer services    Employer: [employer]  Tobacco Use   Smoking status: Former   Smokeless tobacco: Not on file  Substance and Sexual Activity   Alcohol use: Yes    Alcohol/week: 0.0 standard drinks    Comment: social wine   Drug use: Yes    Frequency: 4.0 times per week    Types: Marijuana   Sexual activity: Yes    Partners: Male    Birth control/protection: Condom    Comment: SSP  Other Topics Concern   Not on file  Social History Narrative   Not on file   Social Determinants of Health    Financial Resource Strain: Not on file  Food Insecurity: Not on file  Transportation Needs: Not on file  Physical Activity: Not on file  Stress: Not on file  Social Connections: Not on file    Labs: Lab Results  Component Value Date   HIV1RNAQUANT Not Detected 12/03/2020   HIV1RNAQUANT Not Detected 10/03/2020   HIV1RNAQUANT Not Detected 08/08/2020    RPR and STI Lab Results  Component Value Date   LABRPR NON-REACTIVE 12/03/2020   LABRPR NON-REACTIVE 10/03/2020   LABRPR REACTIVE (A) 08/08/2020   LABRPR NON-REACTIVE 02/22/2020   LABRPR NON-REACTIVE 01/17/2019   RPRTITER 1:1 (H) 08/08/2020   RPRTITER 1:2 11/08/2013    STI Results GC GC CT CT  01/01/2021 Negative - Negative -  01/01/2021 Negative - Negative -  01/01/2021 Negative - Negative -  10/03/2020 Negative - Negative -  10/03/2020 Negative - Negative -  09/30/2016 - NOT DETECTED - NOT DETECTED  04/22/2016 - NOT DETECTED - NOT DETECTED  11/20/2015 - NOT DETECTED - NOT DETECTED  03/20/2015 - NOT DETECTED - NOT DETECTED  03/11/2015 *Negative - *Negative -  09/29/2014 - NEGATIVE - NEGATIVE  06/14/2014 Negative* - Negative* -  06/14/2014 Negative - Negative -  03/29/2014 NG: Negative - CT: Negative -  12/27/2013 - NEGATIVE - NEGATIVE  11/08/2013 - NEGATIVE - NEGATIVE  05/08/2013 - NEGATIVE - NEGATIVE  11/15/2012 - NEGATIVE - NEGATIVE    Hepatitis B Lab Results  Component Value Date   HEPBSAB POS (A) 03/20/2015   HEPBSAG NEGATIVE 03/11/2015   HEPBCAB NON REACTIVE 03/20/2015   Hepatitis C Lab Results  Component Value Date   HEPCAB NON-REACTIVE 08/08/2020   Hepatitis A No results found for: HAV Lipids: Lab Results  Component Value Date   CHOL 138 03/24/2017   TRIG 39 03/24/2017   HDL 46 03/24/2017   CHOLHDL 3.0 03/24/2017   VLDL 7 04/22/2016   LDLCALC 81 03/24/2017    TARGET DATE: The 25th of the month  Assessment: Aidon presents today for their [name] injection and to follow up for HIV PrEP.  Initial/past injection was tolerated well without issues. No problems with systemic side effects of injection.     No signs or symptoms of anything today. Screened patient for acute HIV symptoms such as fatigue, muscle aches, rash, sore throat, lymphadenopathy, headache, night sweats, nausea/vomiting/diarrhea, and fever. Patient denies any symptoms. No new partners since last injection. Counseled that condoms are still advised and encouraged. Rapid HIV antibody blood test was drawn immediately prior to injection and was negative. Patient also had HIV RNA drawn today.   Administered cabotegravir 600mg /43mL in left upper outer quadrant of the gluteal muscle. Monitored patient for 10 minutes after injection. Injection was tolerated well without issue.  Last STI screening was 01/01/21 and was negative. Patient has recovered from mpox and lesions have resolved. Will see him back in 2 months for injection, labs, and HIV PrEP follow up.  Plan: - [name] injection administered - HIV RNA today - Next injection, labs, and PrEP follow up appointment scheduled for 04/08/21 with me - Call with any issues or questions  Keith Becker, PharmD, BCIDP, AAHIVP, CPP Clinical Pharmacist Practitioner Infectious Diseases Clinical Pharmacist [facility]

## 2021-02-07 LAB — HIV-1 RNA QUANT-NO REFLEX-BLD
HIV 1 RNA Quant: NOT DETECTED Copies/mL
HIV-1 RNA Quant, Log: NOT DETECTED Log cps/mL

## 2021-03-26 ENCOUNTER — Telehealth: Payer: Self-pay

## 2021-03-26 NOTE — Telephone Encounter (Signed)
[facility] Patient Advocate Encounter  Patient's medication ([name]) have been couriered to [facility] from [facility] and will be administered on patient next office visit on 04/08/21.  [name] , CPhT Specialty Pharmacy Patient [facility] for Infectious Disease Phone: [phone] Fax:  [phone]

## 2021-04-08 ENCOUNTER — Other Ambulatory Visit: Payer: Medicaid Other

## 2021-04-08 ENCOUNTER — Ambulatory Visit: Payer: Self-pay | Admitting: Pharmacist

## 2021-05-21 DIAGNOSIS — Z3009 Encounter for other general counseling and advice on contraception: Secondary | ICD-10-CM | POA: Diagnosis not present

## 2021-05-21 DIAGNOSIS — Z0389 Encounter for observation for other suspected diseases and conditions ruled out: Secondary | ICD-10-CM | POA: Diagnosis not present

## 2021-05-21 DIAGNOSIS — Z1388 Encounter for screening for disorder due to exposure to contaminants: Secondary | ICD-10-CM | POA: Diagnosis not present

## 2021-07-02 ENCOUNTER — Other Ambulatory Visit (HOSPITAL_COMMUNITY): Payer: Self-pay

## 2021-07-09 ENCOUNTER — Ambulatory Visit (INDEPENDENT_AMBULATORY_CARE_PROVIDER_SITE_OTHER): Payer: Self-pay | Admitting: Pharmacist

## 2021-07-09 ENCOUNTER — Other Ambulatory Visit: Payer: Self-pay

## 2021-07-09 DIAGNOSIS — Z113 Encounter for screening for infections with a predominantly sexual mode of transmission: Secondary | ICD-10-CM

## 2021-07-09 DIAGNOSIS — Z79899 Other long term (current) drug therapy: Secondary | ICD-10-CM

## 2021-07-09 MED ORDER — CABOTEGRAVIR ER 600 MG/3ML IM SUER
600.0000 mg | Freq: Once | INTRAMUSCULAR | Status: AC
  Administered 2021-07-09: 600 mg via INTRAMUSCULAR

## 2021-07-09 NOTE — Progress Notes (Unsigned)
HPI: Keith Becker is a 36 y.o. male who presents to the [facility] pharmacy clinic for [name] administration and HIV PrEP follow up.  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   CABOTEGRAVIR ER ([name]) 600 MG/3ML INJECTION    Inject 3 mLs (600 mg total) into the muscle every 2 (two) months.   VALACYCLOVIR (VALTREX) 500 MG TABLET    Take 1 tablet (500 mg total) by mouth 2 (two) times daily.  Modified Medications   No medications on file  Discontinued Medications   No medications on file    Allergies: Allergies  Allergen Reactions   Grass Pollen(K-O-R-T-Swt Vern) Itching   Pollen Extract-Tree Extract [Pollen Extract] Itching    Past Medical History: Past Medical History:  Diagnosis Date   Asthma    Seasonal allergies    Secondary syphilis     Social History: Social History   Socioeconomic History   Marital status: Single    Spouse name: Not on file   Number of children: Not on file   Years of education: Not on file   Highest education level: Not on file  Occupational History   Occupation: customer services    Employer: [employer]  Tobacco Use   Smoking status: Former   Smokeless tobacco: Not on file  Substance and Sexual Activity   Alcohol use: Yes    Alcohol/week: 0.0 standard drinks    Comment: social wine   Drug use: Yes    Frequency: 4.0 times per week    Types: Marijuana   Sexual activity: Yes    Partners: Male    Birth control/protection: Condom    Comment: SSP  Other Topics Concern   Not on file  Social History Narrative   Not on file   Social Determinants of Health    Financial Resource Strain: Not on file  Food Insecurity: Not on file  Transportation Needs: Not on file  Physical Activity: Not on file  Stress: Not on file  Social Connections: Not on file    Labs: Lab Results  Component Value Date   HIV1RNAQUANT Not Detected 02/05/2021   HIV1RNAQUANT Not Detected 12/03/2020   HIV1RNAQUANT Not Detected 10/03/2020    RPR and STI Lab Results  Component Value Date   LABRPR NON-REACTIVE 12/03/2020   LABRPR NON-REACTIVE 10/03/2020   LABRPR REACTIVE (A) 08/08/2020   LABRPR NON-REACTIVE 02/22/2020   LABRPR NON-REACTIVE 01/17/2019   RPRTITER 1:1 (H) 08/08/2020   RPRTITER 1:2 11/08/2013    STI Results GC GC CT CT  01/01/2021  3:22 PM Negative     Negative    Negative     Negative     01/01/2021 12:00 AM Negative    Negative     10/03/2020  9:48 AM Negative     Negative    Negative     Negative     09/30/2016  9:05 AM  NOT DETECTED    NOT DETECTED    04/22/2016  1:15 PM  NOT DETECTED    NOT DETECTED    11/20/2015  9:20 AM  NOT DETECTED    NOT  DETECTED    03/20/2015 10:26 AM  NOT DETECTED    NOT DETECTED    03/11/2015 12:00 AM *Negative    *Negative     09/29/2014  3:14 PM  NEGATIVE    NEGATIVE    06/14/2014 12:00 AM Negative     Negative*    Negative     Negative*     03/29/2014 12:00 AM NG: Negative    CT: Negative     12/27/2013  4:21 PM  NEGATIVE    NEGATIVE    11/08/2013  2:44 PM  NEGATIVE    NEGATIVE    05/08/2013  4:13 PM  NEGATIVE    NEGATIVE    11/15/2012  3:34 PM  NEGATIVE    NEGATIVE      Hepatitis B Lab Results  Component Value Date   HEPBSAB POS (A) 03/20/2015   HEPBSAG NEGATIVE 03/11/2015   HEPBCAB NON REACTIVE 03/20/2015   Hepatitis C Lab Results  Component Value Date   HEPCAB NON-REACTIVE 08/08/2020   Hepatitis A No results found for: HAV Lipids: Lab Results  Component Value Date   CHOL 138 03/24/2017   TRIG 39 03/24/2017   HDL 46 03/24/2017   CHOLHDL 3.0 03/24/2017   VLDL 7 04/22/2016   LDLCALC 81  03/24/2017    TARGET DATE: 31st of the month  Assessment: Keith Becker presents today for their second initiation injection of [name] and to follow up for HIV PrEP. His last appointment was 12/28 and he no showed for both his February and April appointments. He mentioned having a hard time getting in contact with the front desk and there was a miscommunication as he thought they rescheduled him and he never followed up. Explained that showing up to injection appointments is very important and warned that if appointments are missed, protection will be minimal and the risk of acquiring HIV becomes much higher. We also went over MyChart and its utilities so he can try to avoid missing his appointments in the future. HIV RNA, RPR, and urine cytologies were collected today to determine safety and potential need for additional treatment. Counseled on HPV vaccine series and he wants to wait until the next appointment.  Plan: - First [name] injection administered - Second initiation injection scheduled for 6/30 with Dr. Rogers Blocker  - Maintenance injections scheduled for 8/29 with Dr. Eber Hong - Call with any issues or questions - F/u HIV RNA, RPR, and urine cytologies - F/u if he wants HPV vaccine series at next appointment.   Varney Daily, PharmD PGY1 Pharmacy Resident [facility] for Infectious Disease

## 2021-07-10 LAB — URINE CYTOLOGY ANCILLARY ONLY
Chlamydia: NEGATIVE
Comment: NEGATIVE
Comment: NORMAL
Neisseria Gonorrhea: NEGATIVE

## 2021-07-13 LAB — HIV-1 RNA QUANT-NO REFLEX-BLD
HIV 1 RNA Quant: NOT DETECTED Copies/mL
HIV-1 RNA Quant, Log: NOT DETECTED Log cps/mL

## 2021-07-13 LAB — RPR: RPR Ser Ql: NONREACTIVE

## 2021-07-29 ENCOUNTER — Telehealth: Payer: Self-pay

## 2021-07-29 NOTE — Telephone Encounter (Signed)
[facility] Patient Advocate Encounter  Patient's medication ([name]) have been couriered to [facility] from [facility] and will be administered on the patient next office visit on 08/08/21.  [name] , CPhT Specialty Pharmacy Patient [facility] for Infectious Disease Phone: [phone] Fax:  [phone]

## 2021-08-08 ENCOUNTER — Other Ambulatory Visit: Payer: Self-pay

## 2021-08-08 ENCOUNTER — Telehealth: Payer: Self-pay | Admitting: Pharmacist

## 2021-08-08 ENCOUNTER — Ambulatory Visit: Payer: Self-pay | Admitting: Pharmacist

## 2021-08-08 NOTE — Telephone Encounter (Signed)
Patient arrived to lab 15 minutes prior to appointment today aware that lab cannot start appointments until 8:30. Labs started on time at 8:30, and pharmacist prepared to give [name] after 8:45. Keith Becker explained process to him again about waiting for rapid test to return. Patient's rapid test resulted negative, and blood was drawn. When lab team went to let patient know result was negative and pharmacist would be there soon, patient had left the clinic. He is rescheduled for 7/5 with Keith Becker.  Keith Becker, PharmD, CPP Clinical Pharmacist Practitioner Infectious Diseases Clinical Pharmacist [facility] for Infectious Disease

## 2021-08-13 ENCOUNTER — Other Ambulatory Visit: Payer: Self-pay

## 2021-08-13 ENCOUNTER — Ambulatory Visit (INDEPENDENT_AMBULATORY_CARE_PROVIDER_SITE_OTHER): Payer: Self-pay | Admitting: Pharmacist

## 2021-08-13 DIAGNOSIS — Z79899 Other long term (current) drug therapy: Secondary | ICD-10-CM

## 2021-08-13 MED ORDER — CABOTEGRAVIR ER 600 MG/3ML IM SUER
600.0000 mg | Freq: Once | INTRAMUSCULAR | Status: AC
  Administered 2021-08-13: 600 mg via INTRAMUSCULAR

## 2021-08-13 NOTE — Progress Notes (Signed)
HPI: Keith Becker is a 36 y.o. male who presents to the [facility] pharmacy clinic for [name] administration and HIV PrEP follow up.  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   CABOTEGRAVIR ER ([name]) 600 MG/3ML INJECTION    Inject 3 mLs (600 mg total) into the muscle every 2 (two) months.   VALACYCLOVIR (VALTREX) 500 MG TABLET    Take 1 tablet (500 mg total) by mouth 2 (two) times daily.  Modified Medications   No medications on file  Discontinued Medications   No medications on file    Allergies: Allergies  Allergen Reactions   Grass Pollen(K-O-R-T-Swt Vern) Itching   Pollen Extract-Tree Extract [Pollen Extract] Itching    Past Medical History: Past Medical History:  Diagnosis Date   Asthma    Seasonal allergies    Secondary syphilis     Social History: Social History   Socioeconomic History   Marital status: Single    Spouse name: Not on file   Number of children: Not on file   Years of education: Not on file   Highest education level: Not on file  Occupational History   Occupation: customer services    Employer: [employer]  Tobacco Use   Smoking status: Former   Smokeless tobacco: Not on file  Substance and Sexual Activity   Alcohol use: Yes    Alcohol/week: 0.0 standard drinks of alcohol    Comment: social wine   Drug use: Yes    Frequency: 4.0 times per week    Types: Marijuana   Sexual activity: Yes    Partners: Male    Birth control/protection: Condom    Comment: SSP  Other Topics Concern   Not on file  Social History Narrative   Not on file   Social Determinants  of Health   Financial Resource Strain: Not on file  Food Insecurity: Not on file  Transportation Needs: Not on file  Physical Activity: Not on file  Stress: Not on file  Social Connections: Not on file    Labs: Lab Results  Component Value Date   HIV1RNAQUANT Not Detected 07/09/2021   HIV1RNAQUANT Not Detected 02/05/2021   HIV1RNAQUANT Not Detected 12/03/2020    RPR and STI Lab Results  Component Value Date   LABRPR NON-REACTIVE 07/09/2021   LABRPR NON-REACTIVE 12/03/2020   LABRPR NON-REACTIVE 10/03/2020   LABRPR REACTIVE (A) 08/08/2020   LABRPR NON-REACTIVE 02/22/2020   RPRTITER 1:1 (H) 08/08/2020   RPRTITER 1:2 11/08/2013    STI Results GC GC CT CT  07/09/2021 10:14 AM Negative   Negative    01/01/2021  3:22 PM Negative    Negative   Negative    Negative    01/01/2021 12:00 AM Negative   Negative    10/03/2020  9:48 AM Negative    Negative   Negative    Negative    09/30/2016  9:05 AM  NOT DETECTED   NOT DETECTED   04/22/2016  1:15 PM  NOT DETECTED   NOT DETECTED   11/20/2015  9:20 AM  NOT DETECTED   NOT DETECTED  03/20/2015 10:26 AM  NOT DETECTED   NOT DETECTED   03/11/2015 12:00 AM *Negative   *Negative    09/29/2014  3:14 PM  NEGATIVE   NEGATIVE   06/14/2014 12:00 AM Negative    Negative*   Negative    Negative*    03/29/2014 12:00 AM NG: Negative   CT: Negative    12/27/2013  4:21 PM  NEGATIVE   NEGATIVE   11/08/2013  2:44 PM  NEGATIVE   NEGATIVE   05/08/2013  4:13 PM  NEGATIVE   NEGATIVE   11/15/2012  3:34 PM  NEGATIVE   NEGATIVE     Hepatitis B Lab Results  Component Value Date   HEPBSAB POS (A) 03/20/2015   HEPBSAG NEGATIVE 03/11/2015   HEPBCAB NON REACTIVE 03/20/2015   Hepatitis C Lab Results  Component Value Date   HEPCAB NON-REACTIVE 08/08/2020   Hepatitis A No results found for: "HAV" Lipids: Lab Results  Component Value Date   CHOL 138 03/24/2017   TRIG 39 03/24/2017   HDL 46 03/24/2017   CHOLHDL 3.0 03/24/2017   VLDL 7  04/22/2016   LDLCALC 81 03/24/2017    TARGET DATE: The 31st  Assessment: [name] presents today for his [name] injection and to follow up for HIV PrEP. No issues with past injections. No known exposures to any STIs and no signs or symptoms of any STIs today. Last STI screening was 07/09/21 and was negative. Screened for acute HIV symptoms such as fatigue, muscle aches, rash, sore throat, lymphadenopathy, headache, night sweats, nausea/vomiting/diarrhea, and fever. Denies any symptoms. Rapid HIV blood test was drawn immediately prior to injection and was negative. HIV RNA collected today as well.  Administered cabotegravir 600mg /67mL in left upper outer quadrant of the gluteal muscle. Injection was tolerated well without issue. Will see him back in 2 months for injection, labs, and HIV PrEP follow up.  Plan: - [name] injection administered - HIV RNA today - Next injection, labs, and PrEP follow up appointment scheduled for 10/07/21 - Call with any issues or questions  Krisalyn Yankowski L. Gabriell Casimir, PharmD, BCIDP, AAHIVP, CPP Clinical Pharmacist Practitioner Infectious Diseases Clinical Pharmacist [facility]

## 2021-08-14 ENCOUNTER — Other Ambulatory Visit: Payer: Self-pay

## 2021-08-16 LAB — HIV-1 RNA QUANT-NO REFLEX-BLD
HIV 1 RNA Quant: NOT DETECTED Copies/mL
HIV-1 RNA Quant, Log: NOT DETECTED Log cps/mL

## 2021-08-19 ENCOUNTER — Other Ambulatory Visit (HOSPITAL_COMMUNITY): Payer: Self-pay

## 2021-09-09 ENCOUNTER — Other Ambulatory Visit (HOSPITAL_COMMUNITY): Payer: Self-pay

## 2021-09-09 ENCOUNTER — Telehealth: Payer: Self-pay

## 2021-09-09 NOTE — Telephone Encounter (Signed)
[facility] Patient Advocate Encounter   Received notification from [facility] that prior authorization for [name] is required.   PA submitted on 09/09/21 Key [id number] Status is pending    [facility] will continue to follow.   [name], CPhT Specialty Pharmacy Patient [facility] for Infectious Disease Phone: [phone] Fax:  [phone]

## 2021-09-12 ENCOUNTER — Other Ambulatory Visit: Payer: Self-pay | Admitting: Pharmacist

## 2021-09-12 ENCOUNTER — Telehealth: Payer: Self-pay

## 2021-09-12 ENCOUNTER — Other Ambulatory Visit (HOSPITAL_COMMUNITY): Payer: Self-pay

## 2021-09-12 DIAGNOSIS — Z79899 Other long term (current) drug therapy: Secondary | ICD-10-CM

## 2021-09-12 NOTE — Telephone Encounter (Signed)
[facility] Patient Advocate Encounter  [name] is covered under patient medical benefits.  J-Code and CPT-Code [id number] & [id number] will not need a PA or Pre-cert.  REF # [id number]   Patient will have no office copay.  Patient deductible is $2000 INN and $4700 OON  Which $0.00 have been met.  REF # [id number]  [employer] # [phone]  Keith Becker , [facility] for [facility] Disease Phone: [phone] Fax:  [phone]

## 2021-09-17 ENCOUNTER — Telehealth: Payer: Self-pay | Admitting: Pharmacist

## 2021-09-17 NOTE — Telephone Encounter (Signed)
Patient's specialty medication ([name]) was delivered from [facility] and will be administered at next office visit on 8/29.  Margarite Gouge, PharmD, CPP, BCIDP Clinical Pharmacist Practitioner Infectious Diseases Clinical Pharmacist [facility] for Infect[facility] Disease

## 2021-10-07 ENCOUNTER — Other Ambulatory Visit: Payer: BC Managed Care – PPO

## 2021-10-07 ENCOUNTER — Ambulatory Visit: Payer: Self-pay | Admitting: Pharmacist

## 2021-10-08 ENCOUNTER — Other Ambulatory Visit: Payer: Self-pay

## 2021-10-08 ENCOUNTER — Ambulatory Visit (INDEPENDENT_AMBULATORY_CARE_PROVIDER_SITE_OTHER): Payer: BC Managed Care – PPO | Admitting: Pharmacist

## 2021-10-08 ENCOUNTER — Other Ambulatory Visit: Payer: BC Managed Care – PPO

## 2021-10-08 DIAGNOSIS — Z79899 Other long term (current) drug therapy: Secondary | ICD-10-CM

## 2021-10-08 MED ORDER — CABOTEGRAVIR ER 600 MG/3ML IM SUER
600.0000 mg | Freq: Once | INTRAMUSCULAR | Status: AC
  Administered 2021-10-08: 600 mg via INTRAMUSCULAR

## 2021-10-08 NOTE — Progress Notes (Signed)
HPI: Keith Becker is a 36 y.o. male who presents to the [facility] pharmacy clinic for [name] administration and HIV PrEP follow up.  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   CABOTEGRAVIR ER ([name]) 600 MG/3ML INJECTION    Inject 3 mLs (600 mg total) into the muscle every 2 (two) months.   VALACYCLOVIR (VALTREX) 500 MG TABLET    Take 1 tablet (500 mg total) by mouth 2 (two) times daily.  Modified Medications   No medications on file  Discontinued Medications   No medications on file    Allergies: Allergies  Allergen Reactions   Grass Pollen(K-O-R-T-Swt Vern) Itching   Pollen Extract-Tree Extract [Pollen Extract] Itching    Past Medical History: Past Medical History:  Diagnosis Date   Asthma    Seasonal allergies    Secondary syphilis     Social History: Social History   Socioeconomic History   Marital status: Single    Spouse name: Not on file   Number of children: Not on file   Years of education: Not on file   Highest education level: Not on file  Occupational History   Occupation: customer services    Employer: [employer]  Tobacco Use   Smoking status: Former   Smokeless tobacco: Not on file  Substance and Sexual Activity   Alcohol use: Yes    Alcohol/week: 0.0 standard drinks of alcohol    Comment: social wine   Drug use: Yes    Frequency: 4.0 times per week    Types: Marijuana   Sexual activity: Yes    Partners: Male    Birth control/protection: Condom    Comment: SSP  Other Topics Concern   Not on file  Social History Narrative   Not on file   Social Determinants  of Health   Financial Resource Strain: Not on file  Food Insecurity: Not on file  Transportation Needs: Not on file  Physical Activity: Not on file  Stress: Not on file  Social Connections: Not on file    Labs: Lab Results  Component Value Date   HIV1RNAQUANT Not Detected 08/13/2021   HIV1RNAQUANT Not Detected 07/09/2021   HIV1RNAQUANT Not Detected 02/05/2021    RPR and STI Lab Results  Component Value Date   LABRPR NON-REACTIVE 07/09/2021   LABRPR NON-REACTIVE 12/03/2020   LABRPR NON-REACTIVE 10/03/2020   LABRPR REACTIVE (A) 08/08/2020   LABRPR NON-REACTIVE 02/22/2020   RPRTITER 1:1 (H) 08/08/2020   RPRTITER 1:2 11/08/2013    STI Results GC GC CT CT  07/09/2021 10:14 AM Negative   Negative    01/01/2021  3:22 PM Negative    Negative   Negative    Negative    01/01/2021 12:00 AM Negative   Negative    10/03/2020  9:48 AM Negative    Negative   Negative    Negative    09/30/2016  9:05 AM  NOT DETECTED   NOT DETECTED   04/22/2016  1:15 PM  NOT DETECTED   NOT DETECTED   11/20/2015  9:20 AM  NOT DETECTED   NOT DETECTED  03/20/2015 10:26 AM  NOT DETECTED   NOT DETECTED   03/11/2015 12:00 AM *Negative   *Negative    09/29/2014  3:14 PM  NEGATIVE   NEGATIVE   06/14/2014 12:00 AM Negative    Negative*   Negative    Negative*    03/29/2014 12:00 AM NG: Negative   CT: Negative    12/27/2013  4:21 PM  NEGATIVE   NEGATIVE   11/08/2013  2:44 PM  NEGATIVE   NEGATIVE   05/08/2013  4:13 PM  NEGATIVE   NEGATIVE   11/15/2012  3:34 PM  NEGATIVE   NEGATIVE     Hepatitis B Lab Results  Component Value Date   HEPBSAB POS (A) 03/20/2015   HEPBSAG NEGATIVE 03/11/2015   HEPBCAB NON REACTIVE 03/20/2015   Hepatitis C Lab Results  Component Value Date   HEPCAB NON-REACTIVE 08/08/2020   Hepatitis A No results found for: "HAV" Lipids: Lab Results  Component Value Date   CHOL 138 03/24/2017   TRIG 39 03/24/2017   HDL 46 03/24/2017   CHOLHDL 3.0 03/24/2017   VLDL 7  04/22/2016   LDLCALC 81 03/24/2017    TARGET DATE: The 31st  Assessment: Keith Becker presents today for his [name] injection and to follow up for HIV PrEP. No issues with past injections. No known exposures to any STIs and no signs or symptoms of any STIs today. Last STI screening was 07/09/21 and was negative. Screened for acute HIV symptoms such as fatigue, muscle aches, rash, sore throat, lymphadenopathy, headache, night sweats, nausea/vomiting/diarrhea, and fever. Denies any symptoms. He was treated for syphilis at the health department last week after getting a call that he was exposed. He received Bicillin 2.4 million units x 1. Last RPR was nonreactive in May. He politely declines other STI testing for GC/CT today.  Rapid HIV blood test was drawn immediately prior to injection and was negative. HIV RNA collected today as well.  Administered cabotegravir 600mg /35mL in right upper outer quadrant of the gluteal muscle. Injection was tolerated well without issue. Will see him back in 2 months for injection, labs, and HIV PrEP follow up.  Plan: - [name] injection administered - HIV RNA today - Next injection, labs, and PrEP follow up appointment scheduled for 12/08/21 - Call with any issues or questions  Jaxan Michel L. Sincere Berlanga, PharmD, BCIDP, AAHIVP, CPP Clinical Pharmacist Practitioner Infectious Diseases Clinical Pharmacist [facility]

## 2021-10-12 LAB — HIV-1 RNA QUANT-NO REFLEX-BLD
HIV 1 RNA Quant: NOT DETECTED Copies/mL
HIV-1 RNA Quant, Log: NOT DETECTED Log cps/mL

## 2021-12-03 ENCOUNTER — Ambulatory Visit: Payer: BC Managed Care – PPO | Admitting: Pharmacist

## 2021-12-04 ENCOUNTER — Other Ambulatory Visit: Payer: Self-pay

## 2021-12-04 ENCOUNTER — Other Ambulatory Visit (HOSPITAL_COMMUNITY)
Admission: RE | Admit: 2021-12-04 | Discharge: 2021-12-04 | Disposition: A | Discharge status: Home / Self Care | Payer: BC Managed Care – PPO | Source: Ambulatory Visit | Attending: Infectious Disease | Admitting: Infectious Disease

## 2021-12-04 ENCOUNTER — Ambulatory Visit (INDEPENDENT_AMBULATORY_CARE_PROVIDER_SITE_OTHER): Payer: BC Managed Care – PPO | Admitting: Pharmacist

## 2021-12-04 DIAGNOSIS — Z79899 Other long term (current) drug therapy: Secondary | ICD-10-CM | POA: Diagnosis not present

## 2021-12-04 DIAGNOSIS — Z113 Encounter for screening for infections with a predominantly sexual mode of transmission: Secondary | ICD-10-CM | POA: Diagnosis present

## 2021-12-04 DIAGNOSIS — Z23 Encounter for immunization: Secondary | ICD-10-CM

## 2021-12-04 MED ORDER — CABOTEGRAVIR ER 600 MG/3ML IM SUER
600.0000 mg | Freq: Once | INTRAMUSCULAR | Status: AC
  Administered 2021-12-04: 600 mg via INTRAMUSCULAR

## 2021-12-04 NOTE — Progress Notes (Signed)
HPI: Keith Becker is a 36 y.o. male who presents to the [place] clinic for [name] administration and HIV PrEP follow up.  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   CABOTEGRAVIR ER ([name]) 600 MG/3ML INJECTION    Inject 3 mLs (600 mg total) into the muscle every 2 (two) months.   VALACYCLOVIR (VALTREX) 500 MG TABLET    Take 1 tablet (500 mg total) by mouth 2 (two) times daily.  Modified Medications   No medications on file  Discontinued Medications   No medications on file    Allergies: Allergies  Allergen Reactions   Grass Pollen(K-O-R-T-Swt Vern) Itching   Pollen Extract-Tree Extract [Pollen Extract] Itching    Past Medical History: Past Medical History:  Diagnosis Date   Asthma    Seasonal allergies    Secondary syphilis     Social History: Social History   Socioeconomic History   Marital status: Single    Spouse name: Not on file   Number of children: Not on file   Years of education: Not on file   Highest education level: Not on file  Occupational History   Occupation: customer services    Employer: [employer]  Tobacco Use   Smoking status: Former   Smokeless tobacco: Not on file  Substance and Sexual Activity   Alcohol use: Yes    Alcohol/week: 0.0 standard drinks of alcohol    Comment: social wine   Drug use: Yes    Frequency: 4.0 times per week    Types: Marijuana   Sexual activity: Yes    Partners: Male    Birth control/protection: Condom    Comment: SSP  Other Topics Concern   Not on file  Social History Narrative   Not on file   Social Determinants  of Health   Financial Resource Strain: Not on file  Food Insecurity: Not on file  Transportation Needs: Not on file  Physical Activity: Not on file  Stress: Not on file  Social Connections: Not on file    Labs: Lab Results  Component Value Date   HIV1RNAQUANT Not Detected 10/08/2021   HIV1RNAQUANT Not Detected 08/13/2021   HIV1RNAQUANT Not Detected 07/09/2021    RPR and STI Lab Results  Component Value Date   LABRPR NON-REACTIVE 07/09/2021   LABRPR NON-REACTIVE 12/03/2020   LABRPR NON-REACTIVE 10/03/2020   LABRPR REACTIVE (A) 08/08/2020   LABRPR NON-REACTIVE 02/22/2020   RPRTITER 1:1 (H) 08/08/2020   RPRTITER 1:2 11/08/2013    STI Results GC GC CT CT  07/09/2021 10:14 AM Negative   Negative    01/01/2021  3:22 PM Negative    Negative   Negative    Negative    01/01/2021 12:00 AM Negative   Negative    10/03/2020  9:48 AM Negative    Negative   Negative    Negative    09/30/2016  9:05 AM  NOT DETECTED   NOT DETECTED   04/22/2016  1:15 PM  NOT DETECTED   NOT DETECTED   11/20/2015  9:20 AM  NOT DETECTED   NOT DETECTED  03/20/2015 10:26 AM  NOT DETECTED   NOT DETECTED   03/11/2015 12:00 AM *Negative   *Negative    09/29/2014  3:14 PM  NEGATIVE   NEGATIVE   06/14/2014 12:00 AM Negative    Negative*   Negative    Negative*    03/29/2014 12:00 AM NG: Negative   CT: Negative    12/27/2013  4:21 PM  NEGATIVE   NEGATIVE   11/08/2013  2:44 PM  NEGATIVE   NEGATIVE   05/08/2013  4:13 PM  NEGATIVE   NEGATIVE   11/15/2012  3:34 PM  NEGATIVE   NEGATIVE     Hepatitis B Lab Results  Component Value Date   HEPBSAB POS (A) 03/20/2015   HEPBSAG NEGATIVE 03/11/2015   HEPBCAB NON REACTIVE 03/20/2015   Hepatitis C Lab Results  Component Value Date   HEPCAB NON-REACTIVE 08/08/2020   Hepatitis A No results found for: "HAV" Lipids: Lab Results  Component Value Date   CHOL 138 03/24/2017   TRIG 39 03/24/2017   HDL 46 03/24/2017   CHOLHDL 3.0 03/24/2017   VLDL 7  04/22/2016   LDLCALC 81 03/24/2017    TARGET DATE: 31st  Assessment: Keith Becker presents today for his [name] injection and to follow up for HIV PrEP. No issues with past injections. Screened for acute HIV symptoms such as fatigue, muscle aches, rash, sore throat, lymphadenopathy, headache, night sweats, nausea/vomiting/diarrhea, and fever. Denies any symptoms. No signs or symptoms of any STIs today but he does share that he went to [place] last month and "got a little wild." He agrees to urine and oral STI screening today. He was treated for syphilis at the health department in August. Will defer checking RPR today and will reach out to health department for his initial titer. He agrees to receiving annual flu vaccine but politely declines updated COVID-19 vaccine at this time.   Per [employer] guidelines, a rapid HIV test should be drawn prior to [name] administration. Due to state shortage of rapid HIV tests, this is temporarily unable to be done. Per decision from [place], we will proceed with [name] administration at this time without a negative rapid HIV test beforehand. HIV RNA was collected today and is in process.  Administered cabotegravir 600mg /63mL in left upper outer quadrant of the gluteal muscle. Injection was tolerated well without issue. Will see him back in 2 months for injection, labs, and HIV PrEP follow up.  Plan: - [name] injection administered - HIV RNA today - Check hepatitis A antibody - Check urine and oral cytologies  - Next injection, labs, and PrEP follow up appointment scheduled for 12/27 with Cassie - Call with any issues or questions  Eliseo Gum, PharmD PGY1 Pharmacy Resident   12/04/2021  10:04 AM

## 2021-12-05 LAB — URINE CYTOLOGY ANCILLARY ONLY
Chlamydia: NEGATIVE
Comment: NEGATIVE
Comment: NORMAL
Neisseria Gonorrhea: NEGATIVE

## 2021-12-05 LAB — CYTOLOGY, (ORAL, ANAL, URETHRAL) ANCILLARY ONLY
Chlamydia: NEGATIVE
Comment: NEGATIVE
Comment: NORMAL
Neisseria Gonorrhea: NEGATIVE

## 2021-12-06 LAB — HIV-1 RNA QUANT-NO REFLEX-BLD
HIV 1 RNA Quant: NOT DETECTED Copies/mL
HIV-1 RNA Quant, Log: NOT DETECTED Log cps/mL

## 2021-12-06 LAB — HEPATITIS A ANTIBODY, TOTAL: Hepatitis A AB,Total: REACTIVE — AB

## 2021-12-08 ENCOUNTER — Other Ambulatory Visit: Payer: BC Managed Care – PPO

## 2021-12-08 ENCOUNTER — Ambulatory Visit: Payer: Self-pay | Admitting: Pharmacist

## 2022-02-04 ENCOUNTER — Other Ambulatory Visit: Payer: Self-pay

## 2022-02-04 ENCOUNTER — Ambulatory Visit (INDEPENDENT_AMBULATORY_CARE_PROVIDER_SITE_OTHER): Payer: BC Managed Care – PPO | Admitting: Pharmacist

## 2022-02-04 DIAGNOSIS — Z79899 Other long term (current) drug therapy: Secondary | ICD-10-CM | POA: Diagnosis not present

## 2022-02-04 MED ORDER — CABOTEGRAVIR ER 600 MG/3ML IM SUER
600.0000 mg | Freq: Once | INTRAMUSCULAR | Status: AC
  Administered 2022-02-04: 600 mg via INTRAMUSCULAR

## 2022-02-04 NOTE — Progress Notes (Signed)
HPI: Keith Becker is a 36 y.o. male who presents to the [place] clinic for [name] administration and HIV PrEP follow up.  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   CABOTEGRAVIR ER ([name]) 600 MG/3ML INJECTION    Inject 3 mLs (600 mg total) into the muscle every 2 (two) months.   VALACYCLOVIR (VALTREX) 500 MG TABLET    Take 1 tablet (500 mg total) by mouth 2 (two) times daily.  Modified Medications   No medications on file  Discontinued Medications   No medications on file    Allergies: Allergies  Allergen Reactions   Grass Pollen(K-O-R-T-Swt Vern) Itching   Pollen Extract-Tree Extract [Pollen Extract] Itching    Past Medical History: Past Medical History:  Diagnosis Date   Asthma    Seasonal allergies    Secondary syphilis     Social History: Social History   Socioeconomic History   Marital status: Single    Spouse name: Not on file   Number of children: Not on file   Years of education: Not on file   Highest education level: Not on file  Occupational History   Occupation: customer services    Employer: [employer]  Tobacco Use   Smoking status: Former   Smokeless tobacco: Not on file  Substance and Sexual Activity   Alcohol use: Yes    Alcohol/week: 0.0 standard drinks of alcohol    Comment: social wine   Drug use: Yes    Frequency: 4.0 times per week    Types: Marijuana   Sexual activity: Yes    Partners: Male    Birth control/protection: Condom    Comment: SSP  Other Topics Concern   Not on file  Social History Narrative   Not on file   Social Determinants  of Health   Financial Resource Strain: Not on file  Food Insecurity: Not on file  Transportation Needs: Not on file  Physical Activity: Not on file  Stress: Not on file  Social Connections: Not on file    Labs: Lab Results  Component Value Date   HIV1RNAQUANT Not Detected 12/04/2021   HIV1RNAQUANT Not Detected 10/08/2021   HIV1RNAQUANT Not Detected 08/13/2021    RPR and STI Lab Results  Component Value Date   LABRPR NON-REACTIVE 07/09/2021   LABRPR NON-REACTIVE 12/03/2020   LABRPR NON-REACTIVE 10/03/2020   LABRPR REACTIVE (A) 08/08/2020   LABRPR NON-REACTIVE 02/22/2020   RPRTITER 1:1 (H) 08/08/2020   RPRTITER 1:2 11/08/2013    STI Results GC GC CT CT  12/04/2021 10:13 AM Negative    Negative   Negative    Negative    07/09/2021 10:14 AM Negative   Negative    01/01/2021  3:22 PM Negative    Negative   Negative    Negative    01/01/2021 12:00 AM Negative   Negative    10/03/2020  9:48 AM Negative    Negative   Negative    Negative    09/30/2016  9:05 AM  NOT DETECTED   NOT DETECTED   04/22/2016  1:15 PM  NOT DETECTED  NOT DETECTED   11/20/2015  9:20 AM  NOT DETECTED   NOT DETECTED   03/20/2015 10:26 AM  NOT DETECTED   NOT DETECTED   03/11/2015 12:00 AM *Negative   *Negative    09/29/2014  3:14 PM  NEGATIVE   NEGATIVE   06/14/2014 12:00 AM Negative    Negative*   Negative    Negative*    03/29/2014 12:00 AM NG: Negative   CT: Negative    12/27/2013  4:21 PM  NEGATIVE   NEGATIVE   11/08/2013  2:44 PM  NEGATIVE   NEGATIVE   05/08/2013  4:13 PM  NEGATIVE   NEGATIVE   11/15/2012  3:34 PM  NEGATIVE   NEGATIVE     Hepatitis B Lab Results  Component Value Date   HEPBSAB POS (A) 03/20/2015   HEPBSAG NEGATIVE 03/11/2015   HEPBCAB NON REACTIVE 03/20/2015   Hepatitis C Lab Results  Component Value Date   HEPCAB NON-REACTIVE 08/08/2020   Hepatitis A Lab Results  Component Value Date   HAV REACTIVE (A) 12/04/2021   Lipids: Lab Results  Component Value  Date   CHOL 138 03/24/2017   TRIG 39 03/24/2017   HDL 46 03/24/2017   CHOLHDL 3.0 03/24/2017   VLDL 7 04/22/2016   LDLCALC 81 03/24/2017    TARGET DATE: The 31st  Assessment: Keith Becker presents today for his [name] injection and to follow up for HIV PrEP. No issues with past injections. Screened for acute HIV symptoms such as fatigue, muscle aches, rash, sore throat, lymphadenopathy, headache, night sweats, nausea/vomiting/diarrhea, and fever. Denies any symptoms. No known exposures to any STIs and no signs or symptoms of any STIs today.   Per [employer] guidelines, a rapid HIV test should be drawn prior to [name] administration. Due to state shortage of rapid HIV tests, this is temporarily unable to be done. Per decision from [facility] physicians, we will proceed with [name] administration at this time without a negative rapid HIV test beforehand. HIV RNA was collected today and is in process.  Administered cabotegravir 600mg /62mL in left upper outer quadrant of the gluteal muscle. Injection was tolerated well without issue. Will see him back in 2 months for injection, labs, and HIV PrEP follow up.  Plan: - [name] injection administered - HIV RNA today - Next injection, labs, and PrEP follow up appointment scheduled for 04/07/22 with me - Call with any issues or questions  Norwin Aleman L. Michell Kader, PharmD, BCIDP, AAHIVP, CPP Clinical Pharmacist Practitioner Infectious Diseases Clinical Pharmacist [facility]

## 2022-02-07 LAB — HIV-1 RNA QUANT-NO REFLEX-BLD
HIV 1 RNA Quant: NOT DETECTED Copies/mL
HIV-1 RNA Quant, Log: NOT DETECTED Log cps/mL

## 2022-04-07 ENCOUNTER — Other Ambulatory Visit: Payer: Self-pay

## 2022-04-07 ENCOUNTER — Ambulatory Visit (INDEPENDENT_AMBULATORY_CARE_PROVIDER_SITE_OTHER): Payer: BC Managed Care – PPO | Admitting: Pharmacist

## 2022-04-07 ENCOUNTER — Ambulatory Visit (INDEPENDENT_AMBULATORY_CARE_PROVIDER_SITE_OTHER): Payer: BC Managed Care – PPO

## 2022-04-07 ENCOUNTER — Other Ambulatory Visit (HOSPITAL_COMMUNITY)
Admission: RE | Admit: 2022-04-07 | Discharge: 2022-04-07 | Disposition: A | Discharge status: Home / Self Care | Payer: BC Managed Care – PPO | Source: Ambulatory Visit | Attending: Infectious Disease | Admitting: Infectious Disease

## 2022-04-07 DIAGNOSIS — Z2981 Encounter for HIV pre-exposure prophylaxis: Secondary | ICD-10-CM

## 2022-04-07 DIAGNOSIS — Z113 Encounter for screening for infections with a predominantly sexual mode of transmission: Secondary | ICD-10-CM | POA: Diagnosis present

## 2022-04-07 DIAGNOSIS — Z23 Encounter for immunization: Secondary | ICD-10-CM

## 2022-04-07 DIAGNOSIS — Z79899 Other long term (current) drug therapy: Secondary | ICD-10-CM

## 2022-04-07 MED ORDER — CABOTEGRAVIR ER 600 MG/3ML IM SUER
600.0000 mg | Freq: Once | INTRAMUSCULAR | Status: AC
  Administered 2022-04-07: 600 mg via INTRAMUSCULAR

## 2022-04-07 NOTE — Progress Notes (Signed)
HPI: Keith Becker is a 37 y.o. male who presents to the [facility] clinic for [name] administration and HIV PrEP follow up.  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   CABOTEGRAVIR ER ([name]) 600 MG/3ML INJECTION    Inject 3 mLs (600 mg total) into the muscle every 2 (two) months.   VALACYCLOVIR (VALTREX) 500 MG TABLET    Take 1 tablet (500 mg total) by mouth 2 (two) times daily.  Modified Medications   No medications on file  Discontinued Medications   No medications on file    Allergies: Allergies  Allergen Reactions   Grass Pollen(K-O-R-T-Swt Vern) Itching   Pollen Extract-Tree Extract [Pollen Extract] Itching    Past Medical History: Past Medical History:  Diagnosis Date   Asthma    Seasonal allergies    Secondary syphilis     Social History: Social History   Socioeconomic History   Marital status: Single    Spouse name: Not on file   Number of children: Not on file   Years of education: Not on file   Highest education level: Not on file  Occupational History   Occupation: customer services    Employer: [employer]  Tobacco Use   Smoking status: Former   Smokeless tobacco: Not on file  Substance and Sexual Activity   Alcohol use: Yes    Alcohol/week: 0.0 standard drinks of alcohol    Comment: social wine   Drug use: Yes    Frequency: 4.0 times per week    Types: Marijuana   Sexual activity: Yes    Partners: Male    Birth control/protection: Condom    Comment: SSP  Other Topics Concern   Not on file  Social History Narrative   Not on file   Social Determinants  of Health   Financial Resource Strain: Not on file  Food Insecurity: Not on file  Transportation Needs: Not on file  Physical Activity: Not on file  Stress: Not on file  Social Connections: Not on file    Labs: Lab Results  Component Value Date   HIV1RNAQUANT Not Detected 02/04/2022   HIV1RNAQUANT Not Detected 12/04/2021   HIV1RNAQUANT Not Detected 10/08/2021    RPR and STI Lab Results  Component Value Date   LABRPR NON-REACTIVE 07/09/2021   LABRPR NON-REACTIVE 12/03/2020   LABRPR NON-REACTIVE 10/03/2020   LABRPR REACTIVE (A) 08/08/2020   LABRPR NON-REACTIVE 02/22/2020   RPRTITER 1:1 (H) 08/08/2020   RPRTITER 1:2 11/08/2013    STI Results GC GC CT CT  12/04/2021 10:13 AM Negative    Negative   Negative    Negative    07/09/2021 10:14 AM Negative   Negative    01/01/2021  3:22 PM Negative    Negative   Negative    Negative    01/01/2021 12:00 AM Negative   Negative    10/03/2020  9:48 AM Negative    Negative   Negative    Negative    09/30/2016  9:05 AM  NOT DETECTED   NOT DETECTED   04/22/2016  1:15 PM  NOT DETECTED  NOT DETECTED   11/20/2015  9:20 AM  NOT DETECTED   NOT DETECTED   03/20/2015 10:26 AM  NOT DETECTED   NOT DETECTED   03/11/2015 12:00 AM *Negative   *Negative    09/29/2014  3:14 PM  NEGATIVE   NEGATIVE   06/14/2014 12:00 AM Negative    Negative*   Negative    Negative*    03/29/2014 12:00 AM NG: Negative   CT: Negative    12/27/2013  4:21 PM  NEGATIVE   NEGATIVE   11/08/2013  2:44 PM  NEGATIVE   NEGATIVE   05/08/2013  4:13 PM  NEGATIVE   NEGATIVE   11/15/2012  3:34 PM  NEGATIVE   NEGATIVE     Hepatitis B Lab Results  Component Value Date   HEPBSAB POS (A) 03/20/2015   HEPBSAG NEGATIVE 03/11/2015   HEPBCAB NON REACTIVE 03/20/2015   Hepatitis C Lab Results  Component Value Date   HEPCAB NON-REACTIVE 08/08/2020   Hepatitis A Lab Results  Component Value Date   HAV REACTIVE (A) 12/04/2021   Lipids: Lab Results  Component Value  Date   CHOL 138 03/24/2017   TRIG 39 03/24/2017   HDL 46 03/24/2017   CHOLHDL 3.0 03/24/2017   VLDL 7 04/22/2016   LDLCALC 81 03/24/2017    TARGET DATE: 31st  Assessment: Keith Becker presents today for [name] injection and to follow up for HIV PrEP. No issues with past injections. Screened for acute HIV symptoms such as fatigue, muscle aches, rash, sore throat, lymphadenopathy, headache, night sweats, nausea/vomiting/diarrhea, and fever. Denies any symptoms. No known exposures to any STIs since last visit. He [facility]. He agrees to full STI testing today with  oral/urine/rectal cytologies. Will collect RPR tor TOC after syphilis treatment as well.   He is eligible for COVID, Tdap and HPV vaccines today. He reports that he received mpox vaccination last year. He accepts COVID, Tdap and HPV vaccines today.   Per [employer] guidelines, a rapid HIV test should be drawn prior to [name] administration. Due to state shortage of rapid HIV tests, this is temporarily unable to be done. Per decision from [place], we will proceed with [name] administration at this time without a negative rapid HIV test beforehand. HIV RNA was collected today and is in process.  Administered cabotegravir '600mg'$ /65m in right upper outer quadrant of the gluteal muscle. Will see him back in 2 months for injection, labs, and HIV PrEP follow up.  Plan: - [name] injection administered - HIV RNA, RPR, urine/oral/rectal cytologies today - Next injection, labs, and PrEP follow up appointment scheduled for 06/03/22 with Cassie - Call with any issues or questions  KTitus Dubin PharmD PGY1 Pharmacy Resident 04/07/2022 3:11 PM

## 2022-04-08 LAB — CYTOLOGY, (ORAL, ANAL, URETHRAL) ANCILLARY ONLY
Chlamydia: NEGATIVE
Chlamydia: NEGATIVE
Comment: NEGATIVE
Comment: NEGATIVE
Comment: NORMAL
Comment: NORMAL
Neisseria Gonorrhea: NEGATIVE
Neisseria Gonorrhea: NEGATIVE

## 2022-04-08 LAB — URINE CYTOLOGY ANCILLARY ONLY
Chlamydia: NEGATIVE
Comment: NEGATIVE
Comment: NORMAL
Neisseria Gonorrhea: NEGATIVE

## 2022-04-10 LAB — HIV-1 RNA QUANT-NO REFLEX-BLD
HIV 1 RNA Quant: NOT DETECTED Copies/mL
HIV-1 RNA Quant, Log: NOT DETECTED Log cps/mL

## 2022-04-10 LAB — RPR: RPR Ser Ql: NONREACTIVE

## 2022-05-19 ENCOUNTER — Other Ambulatory Visit: Payer: Self-pay | Admitting: Pharmacist

## 2022-05-19 ENCOUNTER — Other Ambulatory Visit (HOSPITAL_COMMUNITY): Payer: Self-pay

## 2022-05-19 ENCOUNTER — Other Ambulatory Visit: Payer: Self-pay

## 2022-05-19 DIAGNOSIS — Z79899 Other long term (current) drug therapy: Secondary | ICD-10-CM

## 2022-05-19 MED ORDER — APRETUDE 600 MG/3ML IM SUER
600.0000 mg | INTRAMUSCULAR | 5 refills | Status: DC
  Filled 2022-05-19: qty 3, 60d supply, fill #0
  Filled 2022-05-19: qty 3, 34d supply, fill #0
  Filled 2022-07-23: qty 3, 34d supply, fill #1

## 2022-05-20 ENCOUNTER — Other Ambulatory Visit (HOSPITAL_COMMUNITY): Payer: Self-pay

## 2022-05-20 ENCOUNTER — Other Ambulatory Visit: Payer: Self-pay

## 2022-05-21 ENCOUNTER — Telehealth: Payer: Self-pay

## 2022-05-21 NOTE — Telephone Encounter (Signed)
[facility] Patient Advocate Encounter  Patient's medication ([name]) have been couriered to [facility] from [facility] and will be administered on the patient next office visit on 06/03/22.  [name] , CPhT Specialty Pharmacy Patient [facility] for Infectious Disease Phone: [phone] Fax:  [phone]

## 2022-06-03 ENCOUNTER — Ambulatory Visit (INDEPENDENT_AMBULATORY_CARE_PROVIDER_SITE_OTHER): Payer: Medicaid Other | Admitting: Pharmacist

## 2022-06-03 ENCOUNTER — Other Ambulatory Visit: Payer: Self-pay

## 2022-06-03 ENCOUNTER — Other Ambulatory Visit (HOSPITAL_COMMUNITY): Payer: Self-pay

## 2022-06-03 DIAGNOSIS — Z2981 Encounter for HIV pre-exposure prophylaxis: Secondary | ICD-10-CM | POA: Diagnosis present

## 2022-06-03 DIAGNOSIS — Z113 Encounter for screening for infections with a predominantly sexual mode of transmission: Secondary | ICD-10-CM

## 2022-06-03 DIAGNOSIS — Z23 Encounter for immunization: Secondary | ICD-10-CM | POA: Diagnosis not present

## 2022-06-03 DIAGNOSIS — Z79899 Other long term (current) drug therapy: Secondary | ICD-10-CM

## 2022-06-03 MED ORDER — CABOTEGRAVIR ER 600 MG/3ML IM SUER
600.0000 mg | Freq: Once | INTRAMUSCULAR | Status: AC
Start: 2022-06-03 — End: 2022-06-03
  Administered 2022-06-03: 600 mg via INTRAMUSCULAR

## 2022-06-03 NOTE — Progress Notes (Signed)
HPI: Keith Becker is a 37 y.o. male who presents to the [facility] pharmacy clinic for [name] administration and HIV PrEP follow up.  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   CABOTEGRAVIR ER ([name]) 600 MG/3ML INJECTION    Inject 3 mLs (600 mg total) into the muscle every 2 (two) months.   VALACYCLOVIR (VALTREX) 500 MG TABLET    Take 1 tablet (500 mg total) by mouth 2 (two) times daily.  Modified Medications   No medications on file  Discontinued Medications   No medications on file    Allergies: Allergies  Allergen Reactions   Grass Pollen(K-O-R-T-Swt Vern) Itching   Pollen Extract-Tree Extract [Pollen Extract] Itching    Past Medical History: Past Medical History:  Diagnosis Date   Asthma    Seasonal allergies    Secondary syphilis     Social History: Social History   Socioeconomic History   Marital status: Single    Spouse name: Not on file   Number of children: Not on file   Years of education: Not on file   Highest education level: Not on file  Occupational History   Occupation: customer services    Employer: [employer]  Tobacco Use   Smoking status: Former   Smokeless tobacco: Not on file  Substance and Sexual Activity   Alcohol use: Yes    Alcohol/week: 0.0 standard drinks of alcohol    Comment: social wine   Drug use: Yes    Frequency: 4.0 times per week    Types: Marijuana   Sexual activity: Yes    Partners: Male    Birth control/protection: Condom    Comment: SSP  Other Topics Concern   Not on file  Social History Narrative    Not on file   Social Determinants of Health   Financial Resource Strain: Not on file  Food Insecurity: Not on file  Transportation Needs: Not on file  Physical Activity: Not on file  Stress: Not on file  Social Connections: Not on file    Labs: Lab Results  Component Value Date   HIV1RNAQUANT Not Detected 04/07/2022   HIV1RNAQUANT Not Detected 02/04/2022   HIV1RNAQUANT Not Detected 12/04/2021    RPR and STI Lab Results  Component Value Date   LABRPR NON-REACTIVE 04/07/2022   LABRPR NON-REACTIVE 07/09/2021   LABRPR NON-REACTIVE 12/03/2020   LABRPR NON-REACTIVE 10/03/2020   LABRPR REACTIVE (A) 08/08/2020   RPRTITER 1:1 (H) 08/08/2020   RPRTITER 1:2 11/08/2013    STI Results GC GC CT CT  04/07/2022  3:25 PM Negative    Negative    Negative   Negative    Negative    Negative    12/04/2021 10:13 AM Negative    Negative   Negative    Negative    07/09/2021 10:14 AM Negative   Negative    01/01/2021  3:22 PM Negative    Negative   Negative    Negative    01/01/2021 12:00 AM Negative   Negative    10/03/2020  9:48 AM Negative    Negative   Negative    Negative    09/30/2016  9:05 AM  NOT DETECTED   NOT DETECTED   04/22/2016  1:15 PM  NOT DETECTED   NOT DETECTED   11/20/2015  9:20 AM  NOT DETECTED   NOT DETECTED   03/20/2015 10:26 AM  NOT DETECTED   NOT DETECTED   03/11/2015 12:00 AM *Negative   *Negative    09/29/2014  3:14 PM  NEGATIVE   NEGATIVE   06/14/2014 12:00 AM Negative    Negative*   Negative    Negative*    03/29/2014 12:00 AM NG: Negative   CT: Negative    12/27/2013  4:21 PM  NEGATIVE   NEGATIVE   11/08/2013  2:44 PM  NEGATIVE   NEGATIVE   05/08/2013  4:13 PM  NEGATIVE   NEGATIVE   11/15/2012  3:34 PM  NEGATIVE   NEGATIVE     Hepatitis B Lab Results  Component Value Date   HEPBSAB POS (A) 03/20/2015   HEPBSAG NEGATIVE 03/11/2015   HEPBCAB NON REACTIVE 03/20/2015   Hepatitis C Lab Results  Component Value Date   HEPCAB NON-REACTIVE  08/08/2020   Hepatitis A Lab Results  Component Value Date   HAV REACTIVE (A) 12/04/2021   Lipids: Lab Results  Component Value Date   CHOL 138 03/24/2017   TRIG 39 03/24/2017   HDL 46 03/24/2017   CHOLHDL 3.0 03/24/2017   VLDL 7 04/22/2016   LDLCALC 81 03/24/2017    TARGET DATE: The 31st  Assessment: Omran presents today for his [name] injection and to follow up for HIV PrEP. No issues with past injections. Screened for acute HIV symptoms such as fatigue, muscle aches, rash, sore throat, lymphadenopathy, headache, night sweats, nausea/vomiting/diarrhea, and fever. Denies any symptoms. No known exposures to any STIs since last visit. Agrees to full STI testing today with RPR and oral/urine/rectal cytologies. He is due for his 2nd HPV vaccine today.  Per [employer] guidelines, a rapid HIV test should be drawn prior to [name] administration. Due to state shortage of rapid HIV tests, this is temporarily unable to be done. Per decision from [facility] physicians, we will proceed with [name] administration at this time without a negative rapid HIV test beforehand. HIV RNA was collected today and is in process.  Administered cabotegravir /39mL in left upper outer quadrant of the gluteal muscle. Will see him back in 2 months for injection, labs, and HIV PrEP follow up.  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 Will keep an eye on this.  Plan: - [name] injection administered - HIV RNA, RPR, and urine/rectal/pharyngeal cytologies for GC/chlamydia today - Next injection, labs, and PrEP follow up appointment scheduled for 08/05/22 with me - Call with any issues or questions  Vallen Calabrese L. Shellia Hartl, PharmD, BCIDP, AAHIVP, CPP Clinical Pharmacist Practitioner Infectious Diseases Clinical Pharmacist [facility]

## 2022-06-05 LAB — CYTOLOGY, (ORAL, ANAL, URETHRAL) ANCILLARY ONLY
Chlamydia: NEGATIVE
Chlamydia: NEGATIVE
Comment: NEGATIVE
Comment: NEGATIVE
Comment: NORMAL
Comment: NORMAL
Neisseria Gonorrhea: NEGATIVE
Neisseria Gonorrhea: NEGATIVE

## 2022-06-05 LAB — URINE CYTOLOGY ANCILLARY ONLY
Chlamydia: NEGATIVE
Comment: NEGATIVE
Comment: NORMAL
Neisseria Gonorrhea: NEGATIVE

## 2022-06-05 LAB — RPR: RPR Ser Ql: NONREACTIVE

## 2022-06-05 LAB — HIV-1 RNA QUANT-NO REFLEX-BLD
HIV 1 RNA Quant: NOT DETECTED Copies/mL
HIV-1 RNA Quant, Log: NOT DETECTED Log cps/mL

## 2022-07-23 ENCOUNTER — Other Ambulatory Visit (HOSPITAL_COMMUNITY): Payer: Self-pay

## 2022-07-24 ENCOUNTER — Other Ambulatory Visit (HOSPITAL_COMMUNITY): Payer: Self-pay

## 2022-07-28 ENCOUNTER — Telehealth: Payer: Self-pay

## 2022-07-28 NOTE — Telephone Encounter (Signed)
[facility] Patient Advocate Encounter  Patient's medication ([name]) have been couriered to [facility] from [facility] and will be administered on the patient next office visit on 08/05/22.  [name] , CPhT Specialty Pharmacy Patient [facility] for Infectious Disease Phone: [phone] Fax:  [phone]

## 2022-08-05 ENCOUNTER — Ambulatory Visit: Payer: No Typology Code available for payment source | Admitting: Pharmacist

## 2022-08-05 ENCOUNTER — Other Ambulatory Visit (HOSPITAL_COMMUNITY): Payer: Self-pay

## 2022-08-12 ENCOUNTER — Other Ambulatory Visit (HOSPITAL_COMMUNITY): Payer: Self-pay

## 2022-08-12 ENCOUNTER — Other Ambulatory Visit: Payer: Self-pay

## 2022-08-12 ENCOUNTER — Other Ambulatory Visit (HOSPITAL_COMMUNITY)
Admission: RE | Admit: 2022-08-12 | Discharge: 2022-08-12 | Disposition: A | Discharge status: Home / Self Care | Payer: No Typology Code available for payment source | Source: Ambulatory Visit | Attending: Infectious Disease | Admitting: Infectious Disease

## 2022-08-12 ENCOUNTER — Ambulatory Visit: Payer: No Typology Code available for payment source | Admitting: Pharmacist

## 2022-08-12 DIAGNOSIS — Z113 Encounter for screening for infections with a predominantly sexual mode of transmission: Secondary | ICD-10-CM

## 2022-08-12 DIAGNOSIS — Z79899 Other long term (current) drug therapy: Secondary | ICD-10-CM

## 2022-08-12 DIAGNOSIS — Z2981 Encounter for HIV pre-exposure prophylaxis: Secondary | ICD-10-CM | POA: Diagnosis not present

## 2022-08-12 MED ORDER — CABOTEGRAVIR ER 600 MG/3ML IM SUER
600.0000 mg | Freq: Once | INTRAMUSCULAR | Status: AC
Start: 2022-08-12 — End: 2022-08-12
  Administered 2022-08-12: 600 mg via INTRAMUSCULAR

## 2022-08-12 NOTE — Progress Notes (Signed)
HPI: Keith Becker is a 37 y.o. male who presents to the [facility] pharmacy clinic for [name] administration and HIV PrEP follow up.  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   CABOTEGRAVIR ER ([name]) 600 MG/3ML INJECTION    Inject 3 mLs (600 mg total) into the muscle every 2 (two) months.   VALACYCLOVIR (VALTREX) 500 MG TABLET    Take 1 tablet (500 mg total) by mouth 2 (two) times daily.  Modified Medications   No medications on file  Discontinued Medications   No medications on file    Allergies: Allergies  Allergen Reactions   Grass Pollen(K-O-R-T-Swt Vern) Itching   Pollen Extract-Tree Extract [Pollen Extract] Itching    Past Medical History: Past Medical History:  Diagnosis Date   Asthma    Seasonal allergies    Secondary syphilis     Social History: Social History   Socioeconomic History   Marital status: Single    Spouse name: Not on file   Number of children: Not on file   Years of education: Not on file   Highest education level: Not on file  Occupational History   Occupation: customer services    Employer: [employer]  Tobacco Use   Smoking status: Former   Smokeless tobacco: Not on file  Substance and Sexual Activity   Alcohol use: Yes    Alcohol/week: 0.0 standard drinks of alcohol    Comment: social wine   Drug use: Yes    Frequency: 4.0 times per week    Types: Marijuana   Sexual activity: Yes    Partners: Male    Birth control/protection: Condom    Comment: SSP  Other Topics Concern   Not on file  Social History Narrative    Not on file   Social Determinants of Health   Financial Resource Strain: Not on file  Food Insecurity: Not on file  Transportation Needs: Not on file  Physical Activity: Not on file  Stress: Not on file  Social Connections: Not on file    Labs: Lab Results  Component Value Date   HIV1RNAQUANT Not Detected 06/03/2022   HIV1RNAQUANT Not Detected 04/07/2022   HIV1RNAQUANT Not Detected 02/04/2022    RPR and STI Lab Results  Component Value Date   LABRPR NON-REACTIVE 06/03/2022   LABRPR NON-REACTIVE 04/07/2022   LABRPR NON-REACTIVE 07/09/2021   LABRPR NON-REACTIVE 12/03/2020   LABRPR NON-REACTIVE 10/03/2020   RPRTITER 1:1 (H) 08/08/2020   RPRTITER 1:2 11/08/2013    STI Results GC GC CT CT  06/03/2022  3:42 PM Negative    Negative    Negative   Negative    Negative    Negative    04/07/2022  3:25 PM Negative    Negative    Negative   Negative    Negative    Negative    12/04/2021 10:13 AM Negative    Negative   Negative    Negative    07/09/2021 10:14 AM Negative   Negative    01/01/2021  3:22 PM Negative  Negative   Negative    Negative    01/01/2021 12:00 AM Negative   Negative    10/03/2020  9:48 AM Negative    Negative   Negative    Negative    09/30/2016  9:05 AM  NOT DETECTED   NOT DETECTED   04/22/2016  1:15 PM  NOT DETECTED   NOT DETECTED   11/20/2015  9:20 AM  NOT DETECTED   NOT DETECTED   03/20/2015 10:26 AM  NOT DETECTED   NOT DETECTED   03/11/2015 12:00 AM *Negative   *Negative    09/29/2014  3:14 PM  NEGATIVE   NEGATIVE   06/14/2014 12:00 AM Negative    Negative*   Negative    Negative*    03/29/2014 12:00 AM NG: Negative   CT: Negative    12/27/2013  4:21 PM  NEGATIVE   NEGATIVE   11/08/2013  2:44 PM  NEGATIVE   NEGATIVE   05/08/2013  4:13 PM  NEGATIVE   NEGATIVE   11/15/2012  3:34 PM  NEGATIVE   NEGATIVE     Hepatitis B Lab Results  Component Value Date   HEPBSAB POS (A) 03/20/2015   HEPBSAG NEGATIVE 03/11/2015   HEPBCAB NON  REACTIVE 03/20/2015   Hepatitis C Lab Results  Component Value Date   HEPCAB NON-REACTIVE 08/08/2020   Hepatitis A Lab Results  Component Value Date   HAV REACTIVE (A) 12/04/2021   Lipids: Lab Results  Component Value Date   CHOL 138 03/24/2017   TRIG 39 03/24/2017   HDL 46 03/24/2017   CHOLHDL 3.0 03/24/2017   VLDL 7 04/22/2016   LDLCALC 81 03/24/2017    TARGET DATE: The 31st  Assessment: Keith Becker presents today for his [name] injection and to follow up for HIV PrEP. No issues with past injections. Denies any symptoms of acute HIV. Last STI screening was on 06/03/22 and was negative. No known exposures to any STIs since last visit. Agrees to full STI testing today with RPR and oral/urine/rectal cytologies.   Per [employer] guidelines, a rapid HIV test should be drawn prior to [name] administration. Due to state shortage of rapid HIV tests, this is temporarily unable to be done. Per decision from [facility] physicians, we will proceed with [name] administration at this time without a negative rapid HIV test beforehand. HIV RNA was collected today and is in process.  Administered cabotegravir 600mg /50mL in left upper outer quadrant of the gluteal muscle. Will see him back in 2 months for injection, labs, and HIV PrEP follow up.  Plan: - [name] injection administered - HIV RNA, RPR, and urine/rectal/pharyngeal cytologies for GC/chlamydia today - Next injection, labs, and PrEP follow up appointment scheduled for 10/07/22 - Call with any issues or questions  Ludwin Flahive L. Tasia Liz, PharmD, BCIDP, AAHIVP, CPP Clinical Pharmacist Practitioner Infectious Diseases Clinical Pharmacist [facility]

## 2022-08-14 LAB — HIV-1 RNA QUANT-NO REFLEX-BLD
HIV 1 RNA Quant: NOT DETECTED Copies/mL
HIV-1 RNA Quant, Log: NOT DETECTED Log cps/mL

## 2022-08-14 LAB — URINE CYTOLOGY ANCILLARY ONLY
Chlamydia: NEGATIVE
Comment: NEGATIVE
Comment: NORMAL
Neisseria Gonorrhea: NEGATIVE

## 2022-08-14 LAB — CYTOLOGY, (ORAL, ANAL, URETHRAL) ANCILLARY ONLY
Chlamydia: NEGATIVE
Chlamydia: NEGATIVE
Comment: NEGATIVE
Comment: NEGATIVE
Comment: NORMAL
Comment: NORMAL
Neisseria Gonorrhea: NEGATIVE
Neisseria Gonorrhea: NEGATIVE

## 2022-08-14 LAB — RPR: RPR Ser Ql: NONREACTIVE

## 2022-08-17 ENCOUNTER — Other Ambulatory Visit: Payer: Self-pay | Admitting: Pharmacist

## 2022-08-17 DIAGNOSIS — Z79899 Other long term (current) drug therapy: Secondary | ICD-10-CM

## 2022-08-17 MED ORDER — APRETUDE 600 MG/3ML IM SUER
600.0000 mg | INTRAMUSCULAR | 5 refills | Status: DC
Start: 2022-08-17 — End: 2022-09-08

## 2022-09-03 ENCOUNTER — Telehealth: Payer: Self-pay

## 2022-09-03 ENCOUNTER — Other Ambulatory Visit (HOSPITAL_COMMUNITY): Payer: Self-pay

## 2022-09-03 NOTE — Telephone Encounter (Signed)
[facility] Patient Advocate Encounter   Received notification from [facility] that prior authorization for [name] is required.   PA submitted on 09/03/22 Key [id number] Status is pending    [facility] will continue to follow.   [name], CPhT Specialty Pharmacy Patient [facility] for Infectious Disease Phone: [phone] Fax:  [phone]

## 2022-09-08 ENCOUNTER — Other Ambulatory Visit (HOSPITAL_COMMUNITY): Payer: Self-pay

## 2022-09-08 ENCOUNTER — Other Ambulatory Visit: Payer: Self-pay | Admitting: Pharmacist

## 2022-09-08 ENCOUNTER — Other Ambulatory Visit: Payer: Self-pay

## 2022-09-08 DIAGNOSIS — Z79899 Other long term (current) drug therapy: Secondary | ICD-10-CM

## 2022-09-08 MED ORDER — APRETUDE 600 MG/3ML IM SUER
600.0000 mg | INTRAMUSCULAR | 5 refills | Status: DC
Start: 2022-09-08 — End: 2023-10-06
  Filled 2022-09-08: qty 3, 60d supply, fill #0
  Filled 2022-09-08: qty 3, 34d supply, fill #0
  Filled 2022-11-11: qty 3, 34d supply, fill #1
  Filled 2023-01-22: qty 3, 34d supply, fill #2
  Filled 2023-03-08: qty 3, 34d supply, fill #3
  Filled 2023-05-17: qty 3, 34d supply, fill #4
  Filled 2023-07-29: qty 3, 34d supply, fill #5

## 2022-09-08 NOTE — Progress Notes (Signed)
Per Lupita Leash, can fill through [facility]. Resending.

## 2022-09-09 ENCOUNTER — Other Ambulatory Visit: Payer: Self-pay

## 2022-09-11 ENCOUNTER — Telehealth: Payer: Self-pay

## 2022-09-11 NOTE — Telephone Encounter (Signed)
[facility] Patient Advocate Encounter  Patient's medication ([name]) have been couriered to [facility] from [facility] and will be administered on the patient next office visit on 10/07/22.  [name] , CPhT Specialty Pharmacy Patient [facility] for Infectious Disease Phone: [phone] Fax:  [phone]

## 2022-10-06 NOTE — Progress Notes (Signed)
HPI: Keith Becker is a 37 y.o. male who presents to the [facility] pharmacy clinic for [name] administration and HIV PrEP follow up.  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   CABOTEGRAVIR ER ([name]) 600 MG/3ML INJECTION    Inject 3 mLs (600 mg total) into the muscle every 2 (two) months.   VALACYCLOVIR (VALTREX) 500 MG TABLET    Take 1 tablet (500 mg total) by mouth 2 (two) times daily.  Modified Medications   No medications on file  Discontinued Medications   No medications on file    Allergies: Allergies  Allergen Reactions   Grass Pollen(K-O-R-T-Swt Vern) Itching   Pollen Extract-Tree Extract [Pollen Extract] Itching    Labs: Lab Results  Component Value Date   HIV1RNAQUANT Not Detected 08/12/2022   HIV1RNAQUANT Not Detected 06/03/2022   HIV1RNAQUANT Not Detected 04/07/2022    RPR and STI Lab Results  Component Value Date   LABRPR NON-REACTIVE 08/12/2022   LABRPR NON-REACTIVE 06/03/2022   LABRPR NON-REACTIVE 04/07/2022   LABRPR NON-REACTIVE 07/09/2021   LABRPR NON-REACTIVE 12/03/2020   RPRTITER 1:1 (H) 08/08/2020   RPRTITER 1:2 11/08/2013    STI Results GC GC CT CT  08/12/2022  3:13 PM Negative    Negative    Negative   Negative    Negative    Negative    06/03/2022  3:42 PM Negative    Negative    Negative   Negative    Negative    Negative    04/07/2022  3:25 PM Negative    Negative    Negative   Negative    Negative    Negative    12/04/2021 10:13 AM Negative    Negative   Negative    Negative    07/09/2021 10:14 AM  Negative   Negative    01/01/2021  3:22 PM Negative    Negative   Negative    Negative    01/01/2021 12:00 AM Negative   Negative    10/03/2020  9:48 AM Negative    Negative   Negative    Negative    09/30/2016  9:05 AM  NOT DETECTED   NOT DETECTED   04/22/2016  1:15 PM  NOT DETECTED   NOT DETECTED   11/20/2015  9:20 AM  NOT DETECTED   NOT DETECTED   03/20/2015 10:26 AM  NOT DETECTED   NOT DETECTED   03/11/2015 12:00 AM *Negative   *Negative    09/29/2014  3:14 PM  NEGATIVE   NEGATIVE   06/14/2014 12:00 AM Negative    Negative*   Negative    Negative*    03/29/2014 12:00 AM NG: Negative   CT: Negative    12/27/2013  4:21 PM  NEGATIVE   NEGATIVE   11/08/2013  2:44 PM  NEGATIVE   NEGATIVE   05/08/2013  4:13 PM  NEGATIVE   NEGATIVE   11/15/2012  3:34 PM  NEGATIVE   NEGATIVE     Hepatitis B Lab Results  Component Value Date  HEPBSAB POS (A) 03/20/2015   HEPBSAG NEGATIVE 03/11/2015   HEPBCAB NON REACTIVE 03/20/2015   Hepatitis C Lab Results  Component Value Date   HEPCAB NON-REACTIVE 08/08/2020   Hepatitis A Lab Results  Component Value Date   HAV REACTIVE (A) 12/04/2021   Lipids: Lab Results  Component Value Date   CHOL 138 03/24/2017   TRIG 39 03/24/2017   HDL 46 03/24/2017   CHOLHDL 3.0 03/24/2017   VLDL 7 04/22/2016   LDLCALC 81 03/24/2017    TARGET DATE: The 31st of the month  Assessment: Keith Becker presents today for his [name] injection and to follow up for HIV PrEP. No issues with past injections. Denies any symptoms of acute HIV. Last STI screening was on 08/12/22 and was negative. No known exposures to any STIs since last visit. Agrees to full STI testing today with RPR and oral/urine/rectal cytologies.   Per [employer] guidelines, a rapid HIV test should be drawn prior to [name] administration. Due to state shortage of rapid HIV tests, this is temporarily unable to be done. Per decision from [facility] physicians, we will proceed with [name]  administration at this time without a negative rapid HIV test beforehand. HIV RNA was collected today and is in process.  Administered cabotegravir 600mg /46mL in right upper outer quadrant of the gluteal muscle. Will see him back in 2 months for injection, labs, and HIV PrEP follow up.  He is due for his last HPV and influenza vaccine today. He was agreeable to getting both today.   Plan: - [name] injection administered - Received last HPV dose and influenza vaccine today - HIV RNA, RPR, oral/urine/rectal cytologies for GC/chlamydia today - Next injection, labs, and PrEP follow up appointment scheduled for 10/31 and 1/2 with Keith Becker - Call with any issues or questions  Lennie Muckle, PharmD PGY1 Pharmacy Resident 10/06/2022 4:59 PM

## 2022-10-07 ENCOUNTER — Ambulatory Visit: Payer: No Typology Code available for payment source | Admitting: Pharmacist

## 2022-10-07 ENCOUNTER — Other Ambulatory Visit: Payer: Self-pay

## 2022-10-07 DIAGNOSIS — Z2981 Encounter for HIV pre-exposure prophylaxis: Secondary | ICD-10-CM

## 2022-10-07 DIAGNOSIS — Z113 Encounter for screening for infections with a predominantly sexual mode of transmission: Secondary | ICD-10-CM

## 2022-10-07 DIAGNOSIS — Z23 Encounter for immunization: Secondary | ICD-10-CM

## 2022-10-07 DIAGNOSIS — Z79899 Other long term (current) drug therapy: Secondary | ICD-10-CM

## 2022-10-07 MED ORDER — CABOTEGRAVIR ER 600 MG/3ML IM SUER
600.0000 mg | Freq: Once | INTRAMUSCULAR | Status: AC
Start: 2022-10-07 — End: 2022-10-07
  Administered 2022-10-07: 600 mg via INTRAMUSCULAR

## 2022-10-07 NOTE — Addendum Note (Signed)
Addended by: Tressa Busman T on: 10/07/2022 03:57 PM   Modules accepted: Orders

## 2022-10-08 LAB — GC/CHLAMYDIA PROBE, AMP (THROAT)
Chlamydia trachomatis RNA: NOT DETECTED
Neisseria gonorrhoeae RNA: NOT DETECTED

## 2022-10-09 LAB — HIV-1 RNA QUANT-NO REFLEX-BLD
HIV 1 RNA Quant: NOT DETECTED {copies}/mL
HIV-1 RNA Quant, Log: NOT DETECTED {Log_copies}/mL

## 2022-10-09 LAB — RPR: RPR Ser Ql: NONREACTIVE

## 2022-10-29 ENCOUNTER — Other Ambulatory Visit (HOSPITAL_COMMUNITY): Payer: Self-pay

## 2022-11-11 ENCOUNTER — Other Ambulatory Visit: Payer: Self-pay

## 2022-11-11 ENCOUNTER — Other Ambulatory Visit (HOSPITAL_COMMUNITY): Payer: Self-pay

## 2022-11-11 NOTE — Progress Notes (Signed)
Specialty Pharmacy Refill Coordination Note  Keith Becker is a 37 y.o. male contacted today regarding refills of specialty medication(s) Cabotegravir   Patient requested Courier to Provider Office   Delivery date: 11/24/22   Verified address: [facility] [address] [place] [address]   Medication will be filled on 11/23/22.

## 2022-11-20 ENCOUNTER — Other Ambulatory Visit (HOSPITAL_COMMUNITY): Payer: Self-pay

## 2022-11-24 ENCOUNTER — Telehealth: Payer: Self-pay

## 2022-11-24 NOTE — Telephone Encounter (Signed)
[facility] Patient Advocate Encounter  Patient's medications ([name]) have been couriered to [facility] from [facility] pharmacy and will be administered at the patients appointment on 12/10/22.  Kae Heller , CPhT Specialty Pharmacy Patient [facility] for Infectious Disease Phone: [phone] Fax:  [phone]

## 2022-12-09 NOTE — Progress Notes (Unsigned)
HPI: Keith Becker is a 37 y.o. male who presents to the [facility] pharmacy clinic for [name] administration and HIV PrEP follow up.  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   CABOTEGRAVIR ER ([name]) 600 MG/3ML INJECTION    Inject 3 mLs (600 mg total) into the muscle every 2 (two) months.   VALACYCLOVIR (VALTREX) 500 MG TABLET    Take 1 tablet (500 mg total) by mouth 2 (two) times daily.  Modified Medications   No medications on file  Discontinued Medications   No medications on file    Allergies: Allergies  Allergen Reactions   Grass Pollen(K-O-R-T-Swt Vern) Itching   Pollen Extract-Tree Extract [Pollen Extract] Itching    Labs: Lab Results  Component Value Date   HIV1RNAQUANT Not Detected 10/07/2022   HIV1RNAQUANT Not Detected 08/12/2022   HIV1RNAQUANT Not Detected 06/03/2022    RPR and STI Lab Results  Component Value Date   LABRPR NON-REACTIVE 10/07/2022   LABRPR NON-REACTIVE 08/12/2022   LABRPR NON-REACTIVE 06/03/2022   LABRPR NON-REACTIVE 04/07/2022   LABRPR NON-REACTIVE 07/09/2021   RPRTITER 1:1 (H) 08/08/2020   RPRTITER 1:2 11/08/2013    STI Results GC GC CT CT  08/12/2022  3:13 PM Negative    Negative    Negative   Negative    Negative    Negative    06/03/2022  3:42 PM Negative    Negative    Negative   Negative    Negative    Negative    04/07/2022  3:25 PM Negative    Negative    Negative   Negative    Negative    Negative    12/04/2021 10:13 AM Negative    Negative   Negative    Negative    07/09/2021 10:14 AM  Negative   Negative    01/01/2021  3:22 PM Negative    Negative   Negative    Negative    01/01/2021 12:00 AM Negative   Negative    10/03/2020  9:48 AM Negative    Negative   Negative    Negative    09/30/2016  9:05 AM  NOT DETECTED   NOT DETECTED   04/22/2016  1:15 PM  NOT DETECTED   NOT DETECTED   11/20/2015  9:20 AM  NOT DETECTED   NOT DETECTED   03/20/2015 10:26 AM  NOT DETECTED   NOT DETECTED   03/11/2015 12:00 AM *Negative   *Negative    09/29/2014  3:14 PM  NEGATIVE   NEGATIVE   06/14/2014 12:00 AM Negative    Negative*   Negative    Negative*    03/29/2014 12:00 AM NG: Negative   CT: Negative    12/27/2013  4:21 PM  NEGATIVE   NEGATIVE   11/08/2013  2:44 PM  NEGATIVE   NEGATIVE   05/08/2013  4:13 PM  NEGATIVE   NEGATIVE   11/15/2012  3:34 PM  NEGATIVE   NEGATIVE     Hepatitis B Lab Results  Component Value Date  HEPBSAB POS (A) 03/20/2015   HEPBSAG NEGATIVE 03/11/2015   HEPBCAB NON REACTIVE 03/20/2015   Hepatitis C Lab Results  Component Value Date   HEPCAB NON-REACTIVE 08/08/2020   Hepatitis A Lab Results  Component Value Date   HAV REACTIVE (A) 12/04/2021   Lipids: Lab Results  Component Value Date   CHOL 138 03/24/2017   TRIG 39 03/24/2017   HDL 46 03/24/2017   CHOLHDL 3.0 03/24/2017   VLDL 7 04/22/2016   LDLCALC 81 03/24/2017    TARGET DATE: The 31st  Assessment: Keith Becker presents today for his [name] injection and to follow up for HIV PrEP. No issues with past injections. Denies any symptoms of acute HIV. Last STI screening was on 08/12/22 and was negative. No known exposures to any STIs since last visit. Agrees to STI testing today with RPR and oral/urine cytologies. Already received his flu vaccine at his last appointment. Declines COVID.   He had a bill from our lab with him today. It looks like [facility] did not have his updated [employer] or his secondary [employer] on file. I asked him to call and get it straightened out, which  he will do.   Per [employer] guidelines, a rapid HIV test should be drawn prior to [name] administration. Due to state shortage of rapid HIV tests, this is temporarily unable to be done. Per decision from [facility] physicians, we will proceed with [name] administration at this time without a negative rapid HIV test beforehand. HIV RNA was collected today and is in process.  Administered cabotegravir 600mg /13mL in left upper outer quadrant of the gluteal muscle. Will see him back in 2 months for injection, labs, and HIV PrEP follow up.  Plan: - [name] injection administered - HIV RNA, RPR, and urine/oral cytologies for GC/chlamydia today - Next injection, labs, and PrEP follow up appointment scheduled for 02/11/23 with Marchelle Folks  and 04/07/23 with me - Call with any issues or questions  Wylie Russon L. Dearis Danis, PharmD, BCIDP, AAHIVP, CPP Clinical Pharmacist Practitioner Infectious Diseases Clinical Pharmacist [facility]

## 2022-12-10 ENCOUNTER — Other Ambulatory Visit: Payer: Self-pay

## 2022-12-10 ENCOUNTER — Ambulatory Visit: Payer: No Typology Code available for payment source | Admitting: Pharmacist

## 2022-12-10 ENCOUNTER — Other Ambulatory Visit (HOSPITAL_COMMUNITY)
Admission: RE | Admit: 2022-12-10 | Discharge: 2022-12-10 | Disposition: A | Discharge status: Home / Self Care | Payer: No Typology Code available for payment source | Source: Ambulatory Visit | Attending: Infectious Disease | Admitting: Infectious Disease

## 2022-12-10 DIAGNOSIS — Z2981 Encounter for HIV pre-exposure prophylaxis: Secondary | ICD-10-CM

## 2022-12-10 DIAGNOSIS — Z113 Encounter for screening for infections with a predominantly sexual mode of transmission: Secondary | ICD-10-CM

## 2022-12-10 DIAGNOSIS — Z79899 Other long term (current) drug therapy: Secondary | ICD-10-CM

## 2022-12-10 MED ORDER — CABOTEGRAVIR ER 600 MG/3ML IM SUER
600.0000 mg | Freq: Once | INTRAMUSCULAR | Status: AC
Start: 2022-12-10 — End: 2022-12-10
  Administered 2022-12-10: 600 mg via INTRAMUSCULAR

## 2022-12-11 LAB — CYTOLOGY, (ORAL, ANAL, URETHRAL) ANCILLARY ONLY
Chlamydia: NEGATIVE
Comment: NEGATIVE
Comment: NORMAL
Neisseria Gonorrhea: NEGATIVE

## 2022-12-11 LAB — URINE CYTOLOGY ANCILLARY ONLY
Chlamydia: NEGATIVE
Comment: NEGATIVE
Comment: NORMAL
Neisseria Gonorrhea: NEGATIVE

## 2022-12-13 LAB — RPR: RPR Ser Ql: NONREACTIVE

## 2022-12-13 LAB — HIV-1 RNA QUANT-NO REFLEX-BLD
HIV 1 RNA Quant: NOT DETECTED {copies}/mL
HIV-1 RNA Quant, Log: NOT DETECTED {Log_copies}/mL

## 2022-12-17 ENCOUNTER — Other Ambulatory Visit: Payer: Self-pay

## 2022-12-29 ENCOUNTER — Other Ambulatory Visit: Payer: Self-pay

## 2023-01-06 ENCOUNTER — Other Ambulatory Visit: Payer: Self-pay

## 2023-01-22 ENCOUNTER — Other Ambulatory Visit: Payer: Self-pay

## 2023-01-22 ENCOUNTER — Other Ambulatory Visit (HOSPITAL_COMMUNITY): Payer: Self-pay

## 2023-01-22 NOTE — Progress Notes (Signed)
Specialty Pharmacy Refill Coordination Note  Keith Becker is a 37 y.o. male assessed today regarding refills of clinic administered specialty medication(s) Cabotegravir ([name])   Clinic requested Courier to Provider Office   Delivery date: 02/01/23   Verified address: [address] [place] [address]   Medication will be filled on 01/29/23.

## 2023-01-28 DIAGNOSIS — Z114 Encounter for screening for human immunodeficiency virus [HIV]: Secondary | ICD-10-CM | POA: Diagnosis not present

## 2023-01-28 DIAGNOSIS — Z113 Encounter for screening for infections with a predominantly sexual mode of transmission: Secondary | ICD-10-CM | POA: Diagnosis not present

## 2023-01-28 DIAGNOSIS — Z202 Contact with and (suspected) exposure to infections with a predominantly sexual mode of transmission: Secondary | ICD-10-CM | POA: Diagnosis not present

## 2023-01-28 DIAGNOSIS — A630 Anogenital (venereal) warts: Secondary | ICD-10-CM | POA: Diagnosis not present

## 2023-01-28 DIAGNOSIS — Z206 Contact with and (suspected) exposure to human immunodeficiency virus [HIV]: Secondary | ICD-10-CM | POA: Diagnosis not present

## 2023-01-29 ENCOUNTER — Other Ambulatory Visit: Payer: Self-pay

## 2023-02-01 ENCOUNTER — Telehealth: Payer: Self-pay

## 2023-02-01 NOTE — Telephone Encounter (Addendum)
[facility] Patient Advocate Encounter  Patient's medications [name] have been couriered to [facility] from [facility] and will be administered at the patients appointment on 02/11/23.  Kae Heller, CPhT Specialty Pharmacy Patient [facility] for Infectious Disease Phone: [phone] Fax:  [phone]

## 2023-02-09 NOTE — Progress Notes (Signed)
 HPI: Keith Becker is a 37 y.o. male who presents to the [facility] pharmacy clinic for [name]  administration and HIV PrEP follow up.  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   CABOTEGRAVIR  ER ([name] ) 600 MG/3ML INJECTION    Inject 3 mLs (600 mg total) into the muscle every 2 (two) months.   VALACYCLOVIR  (VALTREX ) 500 MG TABLET    Take 1 tablet (500 mg total) by mouth 2 (two) times daily.  Modified Medications   No medications on file  Discontinued Medications   No medications on file    Allergies: Allergies  Allergen Reactions   Grass Pollen(K-O-R-T-Swt Vern) Itching   Pollen Extract-Tree Extract [Pollen Extract] Itching    Past Medical History: Past Medical History:  Diagnosis Date   Asthma    Seasonal allergies    Secondary syphilis     Social History: Social History   Socioeconomic History   Marital status: Single    Spouse name: Not on file   Number of children: Not on file   Years of education: Not on file   Highest education level: Not on file  Occupational History   Occupation: customer services    Employer: [employer]  Tobacco Use   Smoking status: Former   Smokeless tobacco: Not on file  Substance and Sexual Activity   Alcohol use: Yes    Alcohol/week: 0.0 standard drinks of alcohol    Comment: social wine   Drug use: Yes    Frequency: 4.0 times per week    Types: Marijuana   Sexual activity: Yes    Partners: Male    Birth control/protection: Condom    Comment: SSP  Other Topics Concern   Not on file  Social History Narrative   Not on file   Social Drivers of  Health   Financial Resource Strain: Not on file  Food Insecurity: Not on file  Transportation Needs: Not on file  Physical Activity: Not on file  Stress: Not on file  Social Connections: Not on file    Labs: Lab Results  Component Value Date   HIV1RNAQUANT Not Detected 12/10/2022   HIV1RNAQUANT Not Detected 10/07/2022   HIV1RNAQUANT Not Detected 08/12/2022    RPR and STI Lab Results  Component Value Date   LABRPR NON-REACTIVE 12/10/2022   LABRPR NON-REACTIVE 10/07/2022   LABRPR NON-REACTIVE 08/12/2022   LABRPR NON-REACTIVE 06/03/2022   LABRPR NON-REACTIVE 04/07/2022   RPRTITER 1:1 (H) 08/08/2020   RPRTITER 1:2 11/08/2013    STI Results GC GC CT CT  12/10/2022  1:12 PM Negative    Negative   Negative    Negative    08/12/2022  3:13 PM Negative    Negative    Negative   Negative    Negative    Negative    06/03/2022  3:42 PM Negative    Negative    Negative   Negative    Negative    Negative    04/07/2022  3:25 PM Negative    Negative    Negative   Negative    Negative    Negative  12/04/2021 10:13 AM Negative    Negative   Negative    Negative    07/09/2021 10:14 AM Negative   Negative    01/01/2021  3:22 PM Negative    Negative   Negative    Negative    01/01/2021 12:00 AM Negative   Negative    10/03/2020  9:48 AM Negative    Negative   Negative    Negative    09/30/2016  9:05 AM  NOT DETECTED   NOT DETECTED   04/22/2016  1:15 PM  NOT DETECTED   NOT DETECTED   11/20/2015  9:20 AM  NOT DETECTED   NOT DETECTED   03/20/2015 10:26 AM  NOT DETECTED   NOT DETECTED   03/11/2015 12:00 AM *Negative   *Negative    09/29/2014  3:14 PM  NEGATIVE   NEGATIVE   06/14/2014 12:00 AM Negative    Negative*   Negative    Negative*    03/29/2014 12:00 AM NG: Negative   CT: Negative    12/27/2013  4:21 PM  NEGATIVE   NEGATIVE   11/08/2013  2:44 PM  NEGATIVE   NEGATIVE   05/08/2013  4:13 PM  NEGATIVE   NEGATIVE     Hepatitis B Lab Results  Component Value  Date   HEPBSAB POS (A) 03/20/2015   HEPBSAG NEGATIVE 03/11/2015   HEPBCAB NON REACTIVE 03/20/2015   Hepatitis C Lab Results  Component Value Date   HEPCAB NON-REACTIVE 08/08/2020   Hepatitis A Lab Results  Component Value Date   HAV REACTIVE (A) 12/04/2021   Lipids: Lab Results  Component Value Date   CHOL 138 03/24/2017   TRIG 39 03/24/2017   HDL 46 03/24/2017   CHOLHDL 3.0 03/24/2017   VLDL 7 04/22/2016   LDLCALC 81 03/24/2017    TARGET DATE: The 31st of the month  Assessment: Keith Becker presents today for their [name]  injection and to follow up for HIV PrEP. No issues with past injections.  Screened patient for acute HIV symptoms such as fatigue, muscle aches, rash, sore throat, lymphadenopathy, headache, night sweats, nausea/vomiting/diarrhea, and fever. Patient denies any symptoms.   Per [employer] guidelines, a rapid HIV test should be drawn prior to [name]  administration. Due to state shortage of rapid HIV tests, this is temporarily unable to be done. Per decision from [facility] physicians, we will proceed with [name]  administration at this time without a negative rapid HIV test beforehand. HIV RNA was collected today and is in process.  Administered cabotegravir  600mg /60mL in right upper outer quadrant of the gluteal muscle. Will make follow up appointments for maintenance injections every 2 months.   No known exposures to any STIs and no signs or symptoms of any STIs today. Last STI screening was in October and was negative. No new partners since last injection and politely declines STI testing today. States he recently received STI testing through [facility] which returned negative.   Continues to decline COVID vaccination.   Plan: - Administer [name]  600 mg x 1  - Maintenance injections scheduled for 2/26 with Cassie  - Check HIV RNA - Call with any issues or questions  Alan Geralds, PharmD, CPP, BCIDP, AAHIVP Clinical Pharmacist Practitioner Infectious Diseases  Clinical Pharmacist [facility]

## 2023-02-11 ENCOUNTER — Ambulatory Visit (INDEPENDENT_AMBULATORY_CARE_PROVIDER_SITE_OTHER): Payer: No Typology Code available for payment source | Admitting: Pharmacist

## 2023-02-11 ENCOUNTER — Other Ambulatory Visit: Payer: Self-pay

## 2023-02-11 DIAGNOSIS — Z2981 Encounter for HIV pre-exposure prophylaxis: Secondary | ICD-10-CM | POA: Diagnosis not present

## 2023-02-11 DIAGNOSIS — Z113 Encounter for screening for infections with a predominantly sexual mode of transmission: Secondary | ICD-10-CM

## 2023-02-11 DIAGNOSIS — Z79899 Other long term (current) drug therapy: Secondary | ICD-10-CM

## 2023-02-11 MED ORDER — CABOTEGRAVIR ER 600 MG/3ML IM SUER
600.0000 mg | Freq: Once | INTRAMUSCULAR | Status: AC
Start: 2023-02-11 — End: 2023-02-11
  Administered 2023-02-11: 600 mg via INTRAMUSCULAR

## 2023-02-13 LAB — HIV-1 RNA QUANT-NO REFLEX-BLD
HIV 1 RNA Quant: NOT DETECTED {copies}/mL
HIV-1 RNA Quant, Log: NOT DETECTED {Log_copies}/mL

## 2023-02-23 DIAGNOSIS — A5600 Chlamydial infection of lower genitourinary tract, unspecified: Secondary | ICD-10-CM | POA: Diagnosis not present

## 2023-03-08 ENCOUNTER — Other Ambulatory Visit: Payer: Self-pay

## 2023-03-08 ENCOUNTER — Other Ambulatory Visit (HOSPITAL_COMMUNITY): Payer: Self-pay

## 2023-03-08 NOTE — Progress Notes (Signed)
Specialty Pharmacy Refill Coordination Note  Keith Becker is a 38 y.o. male assessed today regarding refills of clinic administered specialty medication(s) Cabotegravir ([name])   Clinic requested Courier to Provider Office   Delivery date: 04/01/23   Verified address: [address] [place] [address]   Medication will be filled on 03/31/23.

## 2023-03-30 ENCOUNTER — Other Ambulatory Visit: Payer: Self-pay

## 2023-03-31 ENCOUNTER — Telehealth: Payer: Self-pay

## 2023-03-31 NOTE — Telephone Encounter (Signed)
[facility] Patient Advocate Encounter  Patient's medications [name] have been couriered to [facility] from [facility] pharmacy and will be administered at the patients appointment on 04/07/23.  [name], CPhT Specialty Pharmacy Patient [facility] for Infectious Disease Phone: [phone] Fax:  [phone]

## 2023-04-07 ENCOUNTER — Other Ambulatory Visit: Payer: Self-pay

## 2023-04-07 ENCOUNTER — Ambulatory Visit (INDEPENDENT_AMBULATORY_CARE_PROVIDER_SITE_OTHER): Payer: No Typology Code available for payment source | Admitting: Pharmacist

## 2023-04-07 ENCOUNTER — Other Ambulatory Visit (HOSPITAL_COMMUNITY)
Admission: RE | Admit: 2023-04-07 | Discharge: 2023-04-07 | Disposition: A | Discharge status: Home / Self Care | Payer: No Typology Code available for payment source | Source: Ambulatory Visit | Attending: Infectious Disease | Admitting: Infectious Disease

## 2023-04-07 DIAGNOSIS — Z79899 Other long term (current) drug therapy: Secondary | ICD-10-CM

## 2023-04-07 DIAGNOSIS — Z113 Encounter for screening for infections with a predominantly sexual mode of transmission: Secondary | ICD-10-CM | POA: Insufficient documentation

## 2023-04-07 DIAGNOSIS — Z2981 Encounter for HIV pre-exposure prophylaxis: Secondary | ICD-10-CM

## 2023-04-07 DIAGNOSIS — Z0000 Encounter for general adult medical examination without abnormal findings: Secondary | ICD-10-CM

## 2023-04-07 MED ORDER — CABOTEGRAVIR ER 600 MG/3ML IM SUER
600.0000 mg | Freq: Once | INTRAMUSCULAR | Status: AC
  Administered 2023-04-07: 600 mg via INTRAMUSCULAR

## 2023-04-07 NOTE — Progress Notes (Signed)
 HPI: Keith Becker is a 38 y.o. male who presents to the [facility] pharmacy clinic for [name] administration and HIV PrEP follow up.  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   CABOTEGRAVIR ER ([name]) 600 MG/3ML INJECTION    Inject 3 mLs (600 mg total) into the muscle every 2 (two) months.   VALACYCLOVIR (VALTREX) 500 MG TABLET    Take 1 tablet (500 mg total) by mouth 2 (two) times daily.  Modified Medications   No medications on file  Discontinued Medications   No medications on file    Allergies: Allergies  Allergen Reactions   Grass Pollen(K-O-R-T-Swt Vern) Itching   Pollen Extract-Tree Extract [Pollen Extract] Itching    Labs: Lab Results  Component Value Date   HIV1RNAQUANT Not Detected 02/11/2023   HIV1RNAQUANT Not Detected 12/10/2022   HIV1RNAQUANT Not Detected 10/07/2022    RPR and STI Lab Results  Component Value Date   LABRPR NON-REACTIVE 12/10/2022   LABRPR NON-REACTIVE 10/07/2022   LABRPR NON-REACTIVE 08/12/2022   LABRPR NON-REACTIVE 06/03/2022   LABRPR NON-REACTIVE 04/07/2022   RPRTITER 1:1 (H) 08/08/2020   RPRTITER 1:2 11/08/2013    STI Results GC GC CT CT  12/10/2022  1:12 PM Negative    Negative   Negative    Negative    08/12/2022  3:13 PM Negative    Negative    Negative   Negative    Negative    Negative    06/03/2022  3:42 PM Negative    Negative    Negative   Negative    Negative    Negative    04/07/2022  3:25 PM Negative    Negative    Negative   Negative    Negative    Negative    12/04/2021 10:13 AM  Negative    Negative   Negative    Negative    07/09/2021 10:14 AM Negative   Negative    01/01/2021  3:22 PM Negative    Negative   Negative    Negative    01/01/2021 12:00 AM Negative   Negative    10/03/2020  9:48 AM Negative    Negative   Negative    Negative    09/30/2016  9:05 AM  NOT DETECTED   NOT DETECTED   04/22/2016  1:15 PM  NOT DETECTED   NOT DETECTED   11/20/2015  9:20 AM  NOT DETECTED   NOT DETECTED   03/20/2015 10:26 AM  NOT DETECTED   NOT DETECTED   03/11/2015 12:00 AM *Negative   *Negative    09/29/2014  3:14 PM  NEGATIVE   NEGATIVE   06/14/2014 12:00 AM Negative    Negative*   Negative    Negative*    03/29/2014 12:00 AM NG: Negative   CT: Negative    12/27/2013  4:21 PM  NEGATIVE   NEGATIVE   11/08/2013  2:44 PM  NEGATIVE   NEGATIVE   05/08/2013  4:13 PM  NEGATIVE   NEGATIVE     Hepatitis  B Lab Results  Component Value Date   HEPBSAB POS (A) 03/20/2015   HEPBSAG NEGATIVE 03/11/2015   HEPBCAB NON REACTIVE 03/20/2015   Hepatitis C Lab Results  Component Value Date   HEPCAB NON-REACTIVE 08/08/2020   Hepatitis A Lab Results  Component Value Date   HAV REACTIVE (A) 12/04/2021   Lipids: Lab Results  Component Value Date   CHOL 138 03/24/2017   TRIG 39 03/24/2017   HDL 46 03/24/2017   CHOLHDL 3.0 03/24/2017   VLDL 7 04/22/2016   LDLCALC 81 03/24/2017    TARGET DATE: 31st of the month  Assessment: Keith Becker presents today for his [name] injection and to follow up for HIV PrEP. No issues with past injections. He usually has some soreness for a day and then it resolves. Overall, he is doing okay. His cousin passed away today so he took off from work. Denies any symptoms of acute HIV. Last STI screening was on 12/10/22 and was negative. He has had 2 new partners since his last visit. He mentioned that he was recently exposed to chlamydia a couple of weeks ago and was prescribed doxycycline. He later realized that he took the antibiotic wrong. He  took 1 tab a day, instead of 2 tabs a day. He agrees to full STI testing today. Will reach out to him if he tests positive. We discussed eligible vaccines today including Mpox vaccine. He mentioned that he has already received Mpox vaccine a while ago. Will check HIV RNA.   Per [employer] guidelines, a rapid HIV test should be drawn prior to [name] administration. Due to state shortage of rapid HIV tests, this is temporarily unable to be done. Per decision from [facility] physicians, we will proceed with [name] administration at this time without a negative rapid HIV test beforehand. HIV RNA was collected today and is in process.  Administered cabotegravir 600mg /14mL in left upper outer quadrant of the gluteal muscle. Will see him back in 2 months for injection, labs, and HIV PrEP follow up.  Plan: - [name] injection administered - HIV RNA today - Next injection, labs, and PrEP follow up appointment scheduled for 06/03/23 with Marchelle Folks. - STI (oral/rectal swab, urine) and RPR today - Call with any issues or questions  Buddy Duty, PharmD Student [facility]

## 2023-04-08 LAB — CYTOLOGY, (ORAL, ANAL, URETHRAL) ANCILLARY ONLY
Chlamydia: NEGATIVE
Chlamydia: NEGATIVE
Comment: NEGATIVE
Comment: NEGATIVE
Comment: NORMAL
Comment: NORMAL
Neisseria Gonorrhea: NEGATIVE
Neisseria Gonorrhea: NEGATIVE

## 2023-04-08 LAB — URINE CYTOLOGY ANCILLARY ONLY
Chlamydia: NEGATIVE
Comment: NEGATIVE
Comment: NORMAL
Neisseria Gonorrhea: NEGATIVE

## 2023-04-11 LAB — HIV-1 RNA QUANT-NO REFLEX-BLD
HIV 1 RNA Quant: NOT DETECTED {copies}/mL
HIV-1 RNA Quant, Log: NOT DETECTED {Log_copies}/mL

## 2023-04-11 LAB — RPR: RPR Ser Ql: NONREACTIVE

## 2023-05-17 ENCOUNTER — Other Ambulatory Visit (HOSPITAL_COMMUNITY): Payer: Self-pay

## 2023-05-17 ENCOUNTER — Other Ambulatory Visit: Payer: Self-pay

## 2023-05-17 NOTE — Progress Notes (Signed)
 Specialty Pharmacy Refill Coordination Note  Keith Becker is a 38 y.o. male assessed today regarding refills of clinic administered specialty medication(s) Cabotegravir ([name])   Clinic requested Courier to Provider Office   Delivery date: 05/28/23   Verified address: [address] [place] [address]   Medication will be filled on 05/27/23.

## 2023-05-25 ENCOUNTER — Other Ambulatory Visit: Payer: Self-pay

## 2023-05-26 ENCOUNTER — Other Ambulatory Visit: Payer: Self-pay

## 2023-05-27 ENCOUNTER — Telehealth: Payer: Self-pay

## 2023-05-27 NOTE — Telephone Encounter (Signed)
 [facility] Patient Advocate Encounter  Patient's medications [name] have been couriered to [facility] from [facility] and will be administered at the patients appointment on 06/03/23.  Verline Glow, CPhT Specialty Pharmacy Patient [facility] for Infectious Disease Phone: [phone] Fax:  [phone]

## 2023-06-03 ENCOUNTER — Ambulatory Visit (INDEPENDENT_AMBULATORY_CARE_PROVIDER_SITE_OTHER): Payer: No Typology Code available for payment source | Admitting: Pharmacist

## 2023-06-03 ENCOUNTER — Other Ambulatory Visit: Payer: Self-pay

## 2023-06-03 DIAGNOSIS — Z2981 Encounter for HIV pre-exposure prophylaxis: Secondary | ICD-10-CM

## 2023-06-03 DIAGNOSIS — Z79899 Other long term (current) drug therapy: Secondary | ICD-10-CM

## 2023-06-03 MED ORDER — CABOTEGRAVIR ER 600 MG/3ML IM SUER
600.0000 mg | Freq: Once | INTRAMUSCULAR | Status: AC
  Administered 2023-06-03: 600 mg via INTRAMUSCULAR

## 2023-06-03 NOTE — Progress Notes (Signed)
 HPI: Keith Becker is a 38 y.o. male who presents to the [facility] pharmacy clinic for [name]  administration and HIV PrEP follow up.  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   CABOTEGRAVIR  ER ([name] ) 600 MG/3ML INJECTION    Inject 3 mLs (600 mg total) into the muscle every 2 (two) months.   VALACYCLOVIR  (VALTREX ) 500 MG TABLET    Take 1 tablet (500 mg total) by mouth 2 (two) times daily.  Modified Medications   No medications on file  Discontinued Medications   No medications on file    Allergies: Allergies  Allergen Reactions   Grass Pollen(K-O-R-T-Swt Vern) Itching   Pollen Extract-Tree Extract [Pollen Extract] Itching    Labs: Lab Results  Component Value Date   HIV1RNAQUANT Not Detected 04/07/2023   HIV1RNAQUANT Not Detected 02/11/2023   HIV1RNAQUANT Not Detected 12/10/2022    RPR and STI Lab Results  Component Value Date   LABRPR NON-REACTIVE 04/07/2023   LABRPR NON-REACTIVE 12/10/2022   LABRPR NON-REACTIVE 10/07/2022   LABRPR NON-REACTIVE 08/12/2022   LABRPR NON-REACTIVE 06/03/2022   RPRTITER 1:1 (H) 08/08/2020   RPRTITER 1:2 11/08/2013    STI Results GC GC CT CT  04/07/2023  1:23 PM Negative    Negative    Negative   Negative    Negative    Negative    12/10/2022  1:12 PM Negative    Negative   Negative    Negative    08/12/2022  3:13 PM Negative    Negative    Negative   Negative    Negative    Negative    06/03/2022  3:42 PM Negative    Negative    Negative   Negative    Negative    Negative    04/07/2022  3:25 PM  Negative    Negative    Negative   Negative    Negative    Negative    12/04/2021 10:13 AM Negative    Negative   Negative    Negative    07/09/2021 10:14 AM Negative   Negative    01/01/2021  3:22 PM Negative    Negative   Negative    Negative    01/01/2021 12:00 AM Negative   Negative    10/03/2020  9:48 AM Negative    Negative   Negative    Negative    09/30/2016  9:05 AM  NOT DETECTED   NOT DETECTED   04/22/2016  1:15 PM  NOT DETECTED   NOT DETECTED   11/20/2015  9:20 AM  NOT DETECTED   NOT DETECTED   03/20/2015 10:26 AM  NOT DETECTED   NOT DETECTED   03/11/2015 12:00 AM *Negative   *Negative    09/29/2014  3:14 PM  NEGATIVE   NEGATIVE   06/14/2014 12:00 AM Negative    Negative*   Negative    Negative*    03/29/2014 12:00 AM NG: Negative   CT: Negative    12/27/2013  4:21 PM  NEGATIVE   NEGATIVE  11/08/2013  2:44 PM  NEGATIVE   NEGATIVE     Hepatitis B Lab Results  Component Value Date   HEPBSAB POS (A) 03/20/2015   HEPBSAG NEGATIVE 03/11/2015   HEPBCAB NON REACTIVE 03/20/2015   Hepatitis C Lab Results  Component Value Date   HEPCAB NON-REACTIVE 08/08/2020   Hepatitis A Lab Results  Component Value Date   HAV REACTIVE (A) 12/04/2021   Lipids: Lab Results  Component Value Date   CHOL 138 03/24/2017   TRIG 39 03/24/2017   HDL 46 03/24/2017   CHOLHDL 3.0 03/24/2017   VLDL 7 04/22/2016   LDLCALC 81 03/24/2017    TARGET DATE: 31  Assessment: Keith Becker presents today for q3mo [name]  injection and to follow up for HIV PrEP. No issues with past injections. Denies any symptoms of acute HIV. No known exposures to any STIs since last visit. Declines STI testing today; recently done at primary care.  Per [employer] guidelines, a rapid HIV test should be drawn prior to [name]  administration. Due to state shortage of rapid HIV tests, this is temporarily unable to be done. Per decision from [facility] physicians, we will proceed with [name]  administration at  this time without a negative rapid HIV test beforehand. HIV RNA was collected today and is in process.  Administered cabotegravir  600mg /51mL in RIGHT upper outer quadrant of the gluteal muscle. Will see Keith Becker back in 2 months for injection, labs, and HIV PrEP follow up.  Plan: - [name]  injection administered - HIV RNA today - Next injection, labs, and PrEP follow up appointment scheduled for July 2 @ 1045 - Call with any issues or questions  Kristopher Pheasant PharmD Candidate

## 2023-06-07 LAB — HIV-1 RNA QUANT-NO REFLEX-BLD
HIV 1 RNA Quant: NOT DETECTED {copies}/mL
HIV-1 RNA Quant, Log: NOT DETECTED {Log_copies}/mL

## 2023-07-29 ENCOUNTER — Other Ambulatory Visit: Payer: Self-pay

## 2023-07-29 ENCOUNTER — Other Ambulatory Visit (HOSPITAL_COMMUNITY): Payer: Self-pay

## 2023-07-29 NOTE — Progress Notes (Signed)
 Specialty Pharmacy Refill Coordination Note  Keith Becker is a 38 y.o. male assessed today regarding refills of clinic administered specialty medication(s) Cabotegravir  ([name] )   Clinic requested Courier to Provider Office   Delivery date: 08/05/23   Verified address: [address] [address]   Medication will be filled on 08/04/23.

## 2023-08-05 ENCOUNTER — Telehealth: Payer: Self-pay

## 2023-08-05 NOTE — Telephone Encounter (Signed)
 [facility] Patient Advocate Encounter  Patient's medications [name]  have been couriered to [facility] from [facility] and will be administered at the patients appointment on 08/10/23.  Charmaine Sharps, CPhT Specialty Pharmacy Patient [facility] for Infectious Disease Phone: [phone] Fax:  [phone]

## 2023-08-10 NOTE — Progress Notes (Unsigned)
 HPI: Keith Becker is a 38 y.o. male who presents to the [facility] pharmacy clinic for [name]  administration and HIV PrEP follow up.  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   CABOTEGRAVIR  ER ([name] ) 600 MG/3ML INJECTION    Inject 3 mLs (600 mg total) into the muscle every 2 (two) months.   VALACYCLOVIR  (VALTREX ) 500 MG TABLET    Take 1 tablet (500 mg total) by mouth 2 (two) times daily.  Modified Medications   No medications on file  Discontinued Medications   No medications on file    Allergies: Allergies  Allergen Reactions   Grass Pollen(K-O-R-T-Swt Vern) Itching   Pollen Extract-Tree Extract [Pollen Extract] Itching    Labs: Lab Results  Component Value Date   HIV1RNAQUANT NOT DETECTED 06/03/2023   HIV1RNAQUANT Not Detected 04/07/2023   HIV1RNAQUANT Not Detected 02/11/2023    RPR and STI Lab Results  Component Value Date   LABRPR NON-REACTIVE 04/07/2023   LABRPR NON-REACTIVE 12/10/2022   LABRPR NON-REACTIVE 10/07/2022   LABRPR NON-REACTIVE 08/12/2022   LABRPR NON-REACTIVE 06/03/2022   RPRTITER 1:1 (H) 08/08/2020   RPRTITER 1:2 11/08/2013    STI Results GC GC CT CT  04/07/2023  1:23 PM Negative    Negative    Negative   Negative    Negative    Negative    12/10/2022  1:12 PM Negative    Negative   Negative    Negative    08/12/2022  3:13 PM Negative    Negative    Negative   Negative    Negative    Negative    06/03/2022  3:42 PM Negative    Negative    Negative   Negative    Negative    Negative    04/07/2022  3:25 PM  Negative    Negative    Negative   Negative    Negative    Negative    12/04/2021 10:13 AM Negative    Negative   Negative    Negative    07/09/2021 10:14 AM Negative   Negative    01/01/2021  3:22 PM Negative    Negative   Negative    Negative    01/01/2021 12:00 AM Negative   Negative    10/03/2020  9:48 AM Negative    Negative   Negative    Negative    09/30/2016  9:05 AM  NOT DETECTED   NOT DETECTED   04/22/2016  1:15 PM  NOT DETECTED   NOT DETECTED   11/20/2015  9:20 AM  NOT DETECTED   NOT DETECTED   03/20/2015 10:26 AM  NOT DETECTED   NOT DETECTED   03/11/2015 12:00 AM *Negative   *Negative    09/29/2014  3:14 PM  NEGATIVE   NEGATIVE   06/14/2014 12:00 AM Negative    Negative*   Negative    Negative*    03/29/2014 12:00 AM NG: Negative   CT: Negative    12/27/2013  4:21 PM  NEGATIVE   NEGATIVE  11/08/2013  2:44 PM  NEGATIVE   NEGATIVE     Hepatitis B Lab Results  Component Value Date   HEPBSAB POS (A) 03/20/2015   HEPBSAG NEGATIVE 03/11/2015   HEPBCAB NON REACTIVE 03/20/2015   Hepatitis C Lab Results  Component Value Date   HEPCAB NON-REACTIVE 08/08/2020   Hepatitis A Lab Results  Component Value Date   HAV REACTIVE (A) 12/04/2021   Lipids: Lab Results  Component Value Date   CHOL 138 03/24/2017   TRIG 39 03/24/2017   HDL 46 03/24/2017   CHOLHDL 3.0 03/24/2017   VLDL 7 04/22/2016   LDLCALC 81 03/24/2017    TARGET DATE: The 31st  Assessment: Keith Becker presents today for his [name]  injection and to follow up for HIV PrEP. No issues with past injections. Denies any symptoms of acute HIV. Last STI screening was on 04/07/23 and was negative. No known exposures to any STIs since last visit. Agrees to STI testing today.    Per [employer] guidelines, a rapid HIV test should be drawn prior to [name]  administration. Due to state shortage of rapid HIV tests, this is temporarily unable to be done. Per decision from [facility] physicians, we will proceed with  [name]  administration at this time without a negative rapid HIV test beforehand. HIV RNA was collected today and is in process.  Administered cabotegravir  600mg /42mL in left upper outer quadrant of the gluteal muscle. Will see him back in 2 months for injection, labs, and HIV PrEP follow up.  Plan: - [name]  injection administered - HIV RNA, RPR, and urine/rectal/pharyngeal cytologies for GC/chlamydia today - Next injection, labs, and PrEP follow up appointment scheduled for 10/13/23 - Call with any issues or questions  Purvis Sidle L. Epimenio Schetter, PharmD, BCIDP, AAHIVP, CPP Clinical Pharmacist Practitioner - Infectious Diseases Clinical Pharmacist Lead - Specialty Pharmacy [facility] 08/10/2023, 3:14 PM

## 2023-08-11 ENCOUNTER — Other Ambulatory Visit: Payer: Self-pay

## 2023-08-11 ENCOUNTER — Other Ambulatory Visit (HOSPITAL_COMMUNITY)
Admission: RE | Admit: 2023-08-11 | Discharge: 2023-08-11 | Disposition: A | Discharge status: Home / Self Care | Source: Ambulatory Visit | Attending: Infectious Disease | Admitting: Infectious Disease

## 2023-08-11 ENCOUNTER — Ambulatory Visit: Admitting: Pharmacist

## 2023-08-11 DIAGNOSIS — Z79899 Other long term (current) drug therapy: Secondary | ICD-10-CM

## 2023-08-11 DIAGNOSIS — Z113 Encounter for screening for infections with a predominantly sexual mode of transmission: Secondary | ICD-10-CM

## 2023-08-11 DIAGNOSIS — Z2981 Encounter for HIV pre-exposure prophylaxis: Secondary | ICD-10-CM

## 2023-08-11 MED ORDER — CABOTEGRAVIR ER 600 MG/3ML IM SUER
600.0000 mg | Freq: Once | INTRAMUSCULAR | Status: AC
  Administered 2023-08-11: 600 mg via INTRAMUSCULAR

## 2023-08-12 LAB — URINE CYTOLOGY ANCILLARY ONLY
Chlamydia: NEGATIVE
Comment: NEGATIVE
Comment: NORMAL
Neisseria Gonorrhea: NEGATIVE

## 2023-08-12 LAB — CYTOLOGY, (ORAL, ANAL, URETHRAL) ANCILLARY ONLY
Chlamydia: NEGATIVE
Chlamydia: NEGATIVE
Comment: NEGATIVE
Comment: NEGATIVE
Comment: NORMAL
Comment: NORMAL
Neisseria Gonorrhea: NEGATIVE
Neisseria Gonorrhea: NEGATIVE

## 2023-08-13 LAB — HIV-1 RNA QUANT-NO REFLEX-BLD
HIV 1 RNA Quant: NOT DETECTED {copies}/mL
HIV-1 RNA Quant, Log: NOT DETECTED {Log_copies}/mL

## 2023-08-13 LAB — RPR: RPR Ser Ql: NONREACTIVE

## 2023-08-18 DIAGNOSIS — Z13220 Encounter for screening for lipoid disorders: Secondary | ICD-10-CM | POA: Diagnosis not present

## 2023-08-18 DIAGNOSIS — F411 Generalized anxiety disorder: Secondary | ICD-10-CM | POA: Diagnosis not present

## 2023-08-18 DIAGNOSIS — Z202 Contact with and (suspected) exposure to infections with a predominantly sexual mode of transmission: Secondary | ICD-10-CM | POA: Diagnosis not present

## 2023-08-18 DIAGNOSIS — Z113 Encounter for screening for infections with a predominantly sexual mode of transmission: Secondary | ICD-10-CM | POA: Diagnosis not present

## 2023-08-18 DIAGNOSIS — Z1329 Encounter for screening for other suspected endocrine disorder: Secondary | ICD-10-CM | POA: Diagnosis not present

## 2023-08-18 DIAGNOSIS — Z206 Contact with and (suspected) exposure to human immunodeficiency virus [HIV]: Secondary | ICD-10-CM | POA: Diagnosis not present

## 2023-08-18 DIAGNOSIS — Z131 Encounter for screening for diabetes mellitus: Secondary | ICD-10-CM | POA: Diagnosis not present

## 2023-08-18 DIAGNOSIS — Z8619 Personal history of other infectious and parasitic diseases: Secondary | ICD-10-CM | POA: Diagnosis not present

## 2023-09-14 NOTE — Progress Notes (Deleted)
   NEUROLOGY CONSULTATION NOTE  Keith Becker MRN: [id number] DOB: 07/06/1985  Referring provider: Marylen Grise, OD Primary care provider: ***  Reason for consult:  vision loss  Assessment/Plan:   Unexplained subjective vision loss with normal dilated eye exam.  Check MRI of brain and orbits with and without contrast ***   Subjective:  Keith Becker is a 38 year old ***-handed male with asthma and secondary syphilis who presents for unexplained vision loss in the right eye.  History supplemented by referring provider's note.  He noted decreased vision in both eyes, like a film over them.  No associated pain.  He saw his optometrist, Dr. Grise, in June and stated that it involved both eyes.  Exam at that time was unremarkable.  He had repeat testing in July and then stated that it only involved the right eye.  Dilated eye exam was again unremarkable.    He did have a *** eye injury in November 2021.  ***     PAST MEDICAL HISTORY: Past Medical History:  Diagnosis Date   Asthma    Seasonal allergies    Secondary syphilis     PAST SURGICAL HISTORY: Past Surgical History:  Procedure Laterality Date   TONSILLECTOMY      MEDICATIONS: Current Outpatient Medications on File Prior to Visit  Medication Sig Dispense Refill   acyclovir  (ZOVIRAX ) 200 MG capsule Take 1 capsule (200 mg total) by mouth 5 (five) times daily. 20 capsule 0   cabotegravir  ER ([name] ) 600 MG/3ML injection Inject 3 mLs (600 mg total) into the muscle every 2 (two) months. 3 mL 5   valACYclovir  (VALTREX ) 500 MG tablet Take 1 tablet (500 mg total) by mouth 2 (two) times daily. 14 tablet 6   No current facility-administered medications on file prior to visit.    ALLERGIES: Allergies  Allergen Reactions   Grass Pollen(K-O-R-T-Swt Vern) Itching   Pollen Extract-Tree Extract [Pollen Extract] Itching    FAMILY HISTORY: Family History  Problem Relation Age of Onset   Hypertension Mother     Hypertension Father     Objective:  *** General: No acute distress.  Patient appears well-groomed.   Head:  Normocephalic/atraumatic Eyes:  fundi examined but not visualized Neck: supple, no paraspinal tenderness, full range of motion Heart: regular rate and rhythm Neurological Exam: Mental status: alert and oriented to person, place, and time, speech fluent and not dysarthric, language intact. Cranial nerves: CN I: not tested CN II: pupils equal, round and reactive to light, visual fields intact CN III, IV, VI:  full range of motion, no nystagmus, no ptosis CN V: facial sensation intact. CN VII: upper and lower face symmetric CN VIII: hearing intact CN IX, X: gag intact, uvula midline CN XI: sternocleidomastoid and trapezius muscles intact CN XII: tongue midline Bulk & Tone: normal, no fasciculations. Motor:  muscle strength 5/5 throughout Sensation:  Pinprick, temperature and vibratory sensation intact. Deep Tendon Reflexes:  2+ throughout,  toes downgoing.   Finger to nose testing:  Without dysmetria.   Heel to shin:  Without dysmetria.   Gait:  Normal station and stride.  Romberg negative.    Thank you for allowing me to take part in the care of this patient.  Juliene Dunnings, DO  CC: ***

## 2023-09-15 ENCOUNTER — Ambulatory Visit: Admitting: Neurology

## 2023-09-24 ENCOUNTER — Other Ambulatory Visit (HOSPITAL_COMMUNITY): Payer: Self-pay

## 2023-09-28 NOTE — Progress Notes (Unsigned)
 NEUROLOGY CONSULTATION NOTE  Keith Becker MRN: [id number] DOB: 23-Jan-1986  Referring provider: Marylen Grise, OD Primary care provider: No PCP  Reason for consult:  vision loss  Assessment/Plan:   Blurred vision of right eye  From a neurologic standpoint, monocular blurred vision would most likely originate from the optic nerve.  However, he does not exhibit any symptoms (such as ocular pain or color desaturation) or findings on dilated eye exam to suggest involvement of optic nerve.  Otherwise, I would suspect ocular etiology.    Will check MRI of brain and orbits with and without contrast to further evaluate optic nerve and to rule out brain abnormalities that may point to explanation.   Further recommendations pending results.   Subjective:  Keith Becker is a 38 year old right-handed male with asthma and secondary syphilis who presents for unexplained vision loss in the right eye.  History supplemented by referring provider's note.   Around March or April, he noticed increased photosensitivity to bright light and glare.  He also noticed difficulty reading.  Vision appeared blurred.  Occasional mild headache but no significant headaches, ocular pain, color desaturation, or unilateral numbness or weakness.  He saw his optometrist in June.  Exam at that time was unremarkable.  He had repeat testing in July again revealed normal dilated eye exam and visual field testing.  Symptoms have remained stable.    He has remote history of left eyelid laceration requiring stitches but otherwise no history of eye injury.  Denies history of migraines.    PAST MEDICAL HISTORY: Past Medical History:  Diagnosis Date   Asthma    Seasonal allergies    Secondary syphilis     PAST SURGICAL HISTORY: Past Surgical History:  Procedure Laterality Date   TONSILLECTOMY      MEDICATIONS: Current Outpatient Medications on File Prior to Visit  Medication Sig Dispense Refill   acyclovir   (ZOVIRAX ) 200 MG capsule Take 1 capsule (200 mg total) by mouth 5 (five) times daily. 20 capsule 0   cabotegravir  ER ([name] ) 600 MG/3ML injection Inject 3 mLs (600 mg total) into the muscle every 2 (two) months. 3 mL 5   valACYclovir  (VALTREX ) 500 MG tablet Take 1 tablet (500 mg total) by mouth 2 (two) times daily. 14 tablet 6   No current facility-administered medications on file prior to visit.    ALLERGIES: Allergies  Allergen Reactions   Grass Pollen(K-O-R-T-Swt Vern) Itching   Pollen Extract-Tree Extract [Pollen Extract] Itching    FAMILY HISTORY: Family History  Problem Relation Age of Onset   Hypertension Mother    Hypertension Father     Objective:  Blood pressure 117/77, pulse 72, height 5' 10 (1.778 m), weight 156 lb (70.8 kg), SpO2 98%. General: No acute distress.  Patient appears well-groomed.   Head:  Normocephalic/atraumatic Eyes:  fundi examined but not visualized Neck: supple, no paraspinal tenderness, full range of motion Heart: regular rate and rhythm Neurological Exam: Mental status: alert and oriented to person, place, and time, speech fluent and not dysarthric, language intact. Cranial nerves: CN I: not tested CN II: pupils equal, round and reactive to light, visual fields intact.  Endorses monocular blurred vision in right eye (present when closing left eye, resolved when closing right eye) CN III, IV, VI:  full range of motion, no nystagmus, no ptosis CN V: facial sensation intact. CN VII: upper and lower face symmetric CN VIII: hearing intact CN IX, X: gag intact, uvula midline CN XI: sternocleidomastoid and trapezius  muscles intact CN XII: tongue midline Bulk & Tone: normal, no fasciculations. Motor:  muscle strength 5/5 throughout Sensation:  Pinprick, temperature and vibratory sensation intact. Deep Tendon Reflexes:  2+ throughout,  toes downgoing.   Finger to nose testing:  Without dysmetria.   Heel to shin:  Without dysmetria.   Gait:   Normal station and stride.  Romberg negative.    Thank you for allowing me to take part in the care of this patient.  Juliene Dunnings, DO  CC: Marylen Grise, OD

## 2023-09-29 ENCOUNTER — Encounter: Payer: Self-pay | Admitting: Neurology

## 2023-09-29 ENCOUNTER — Ambulatory Visit (INDEPENDENT_AMBULATORY_CARE_PROVIDER_SITE_OTHER): Admitting: Neurology

## 2023-09-29 VITALS — BP 117/77 | HR 72 | Ht 70.0 in | Wt 156.0 lb

## 2023-09-29 DIAGNOSIS — H5461 Unqualified visual loss, right eye, normal vision left eye: Secondary | ICD-10-CM

## 2023-09-29 NOTE — Patient Instructions (Signed)
 Check MRI of brain and orbits with and without contrast.  Further recommendations pending results.  If unremarkable, would refer you to ophthalmology

## 2023-10-06 ENCOUNTER — Other Ambulatory Visit

## 2023-10-06 ENCOUNTER — Other Ambulatory Visit (HOSPITAL_COMMUNITY): Payer: Self-pay

## 2023-10-06 ENCOUNTER — Other Ambulatory Visit: Payer: Self-pay

## 2023-10-06 ENCOUNTER — Other Ambulatory Visit: Payer: Self-pay | Admitting: Pharmacist

## 2023-10-06 DIAGNOSIS — Z79899 Other long term (current) drug therapy: Secondary | ICD-10-CM

## 2023-10-06 MED ORDER — APRETUDE 600 MG/3ML IM SUER
600.0000 mg | INTRAMUSCULAR | 5 refills | Status: AC
  Filled 2023-10-06: qty 3, 34d supply, fill #0

## 2023-10-06 NOTE — Progress Notes (Signed)
 Specialty Pharmacy Refill Coordination Note  Keith Becker is a 38 y.o. male assessed today regarding refills of clinic administered specialty medication(s) Cabotegravir  ([name] )   Clinic requested Courier to Provider Office   Delivery date: 10/08/23   Verified address: [address] [place] [address]   Medication will be filled on 10/07/23.

## 2023-10-07 ENCOUNTER — Other Ambulatory Visit: Payer: Self-pay

## 2023-10-08 ENCOUNTER — Telehealth: Payer: Self-pay

## 2023-10-08 NOTE — Telephone Encounter (Signed)
 [facility] Patient Advocate Encounter  Patient's medications [name]  have been couriered to [facility] from [facility] and will be administered at the patients appointment on 10/13/23.  Charmaine Sharps, CPhT Specialty Pharmacy Patient [facility] for Infectious Disease Phone: [phone] Fax:  [phone]

## 2023-10-08 NOTE — Telephone Encounter (Signed)
 ERROR

## 2023-10-12 NOTE — Progress Notes (Deleted)
 HPI: Keith Becker is a 38 y.o. male who presents to the [facility] pharmacy clinic for [name]  administration and HIV PrEP follow up.  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  Modified Medications   No medications on file  Discontinued Medications   No medications on file    Allergies: Allergies  Allergen Reactions   Grass Pollen(K-O-R-T-Swt Vern) Itching   Pollen Extract-Tree Extract [Pollen Extract] Itching    Labs: Lab Results  Component Value Date   HIV1RNAQUANT NOT DETECTED 08/11/2023   HIV1RNAQUANT NOT DETECTED 06/03/2023   HIV1RNAQUANT Not Detected 04/07/2023    RPR and STI Lab Results  Component Value Date   LABRPR NON-REACTIVE 08/11/2023   LABRPR NON-REACTIVE 04/07/2023   LABRPR NON-REACTIVE 12/10/2022   LABRPR NON-REACTIVE 10/07/2022   LABRPR NON-REACTIVE 08/12/2022   RPRTITER 1:1 (H) 08/08/2020   RPRTITER 1:2 11/08/2013    STI Results GC GC CT CT  08/11/2023 11:09 AM Negative    Negative    Negative   Negative    Negative    Negative    04/07/2023  1:23 PM Negative    Negative    Negative   Negative    Negative    Negative    12/10/2022  1:12 PM Negative    Negative   Negative    Negative    08/12/2022  3:13 PM Negative    Negative    Negative   Negative    Negative    Negative    06/03/2022  3:42 PM Negative    Negative    Negative   Negative    Negative    Negative    04/07/2022  3:25 PM Negative    Negative    Negative   Negative    Negative    Negative    12/04/2021 10:13 AM Negative     Negative   Negative    Negative    07/09/2021 10:14 AM Negative   Negative    01/01/2021  3:22 PM Negative    Negative   Negative    Negative    01/01/2021 12:00 AM Negative   Negative    10/03/2020  9:48 AM Negative    Negative   Negative    Negative    09/30/2016  9:05 AM  NOT DETECTED   NOT DETECTED   04/22/2016  1:15 PM  NOT DETECTED   NOT DETECTED   11/20/2015  9:20 AM  NOT DETECTED   NOT DETECTED   03/20/2015 10:26 AM  NOT DETECTED   NOT DETECTED   03/11/2015 12:00 AM *Negative   *Negative    09/29/2014  3:14 PM  NEGATIVE   NEGATIVE   06/14/2014 12:00 AM Negative    Negative*   Negative    Negative*    03/29/2014 12:00 AM NG: Negative   CT: Negative    12/27/2013  4:21 PM  NEGATIVE   NEGATIVE     Hepatitis B Lab Results  Component Value Date   HEPBSAB POS (A) 03/20/2015  HEPBSAG NEGATIVE 03/11/2015   HEPBCAB NON REACTIVE 03/20/2015   Hepatitis C Lab Results  Component Value Date   HEPCAB NON-REACTIVE 08/08/2020   Hepatitis A Lab Results  Component Value Date   HAV REACTIVE (A) 12/04/2021   Lipids: Lab Results  Component Value Date   CHOL 138 03/24/2017   TRIG 39 03/24/2017   HDL 46 03/24/2017   CHOLHDL 3.0 03/24/2017   VLDL 7 04/22/2016   LDLCALC 81 03/24/2017    TARGET DATE: The 31st  Assessment: Keith Becker presents today for his [name]  injection and to follow up for HIV PrEP. No issues with past injections. Denies any symptoms of acute HIV. Last HIV RNA was negative on 08/11/23. No known exposures to any STIs since last visit. ***Agrees to STI testing today. FLU VACCINE  Per CDC national guidelines, a rapid HIV test should be drawn prior to [name]  administration. Due to state shortage of rapid HIV tests, this is temporarily unable to be done. Per decision from [facility] physicians, we will proceed with [name]  administration at this time without a negative rapid HIV test beforehand. HIV RNA was collected today and is in process.  Administered  cabotegravir  600mg /63mL in right upper outer quadrant of the gluteal muscle. Will see him back in 2 months for injection, labs, and HIV PrEP follow up.  Plan: - [name]  injection administered - HIV RNA today - Next injection, labs, and PrEP follow up appointment scheduled for *** - Call with any issues or questions  Keith Becker L. Reynolds Kittel, PharmD, BCIDP, AAHIVP, CPP Clinical Pharmacist Practitioner - Infectious Diseases Clinical Pharmacist Lead - Specialty Pharmacy [facility] for Infectious Disease

## 2023-10-13 ENCOUNTER — Ambulatory Visit: Admitting: Pharmacist

## 2023-10-13 DIAGNOSIS — Z79899 Other long term (current) drug therapy: Secondary | ICD-10-CM

## 2023-10-13 DIAGNOSIS — Z113 Encounter for screening for infections with a predominantly sexual mode of transmission: Secondary | ICD-10-CM

## 2023-10-14 ENCOUNTER — Ambulatory Visit
Admission: RE | Admit: 2023-10-14 | Discharge: 2023-10-14 | Disposition: A | Discharge status: Home / Self Care | Source: Ambulatory Visit | Attending: Neurology

## 2023-10-14 DIAGNOSIS — H5461 Unqualified visual loss, right eye, normal vision left eye: Secondary | ICD-10-CM

## 2023-10-14 MED ORDER — GADOPICLENOL 0.5 MMOL/ML IV SOLN
7.5000 mL | Freq: Once | INTRAVENOUS | Status: AC | PRN
  Administered 2023-10-14: 7.5 mL via INTRAVENOUS

## 2023-10-15 ENCOUNTER — Ambulatory Visit: Payer: Self-pay | Admitting: Neurology

## 2023-12-29 ENCOUNTER — Other Ambulatory Visit (HOSPITAL_COMMUNITY): Payer: Self-pay

## 2024-01-03 ENCOUNTER — Other Ambulatory Visit (HOSPITAL_COMMUNITY): Payer: Self-pay

## 2024-01-14 ENCOUNTER — Other Ambulatory Visit: Payer: Self-pay

## 2024-01-14 NOTE — Progress Notes (Signed)
 Patient fell out of care
